# Patient Record
Sex: Female | Born: 1965 | Race: Black or African American | Hispanic: No | Marital: Married | Smoking: Never smoker
Health system: Southern US, Community
[De-identification: ages and names within clinical notes are randomized; demographics above are authoritative.]

## PROBLEM LIST (undated history)

## (undated) DIAGNOSIS — F419 Anxiety disorder, unspecified: Secondary | ICD-10-CM

## (undated) DIAGNOSIS — H469 Unspecified optic neuritis: Secondary | ICD-10-CM

## (undated) DIAGNOSIS — M1990 Unspecified osteoarthritis, unspecified site: Secondary | ICD-10-CM

## (undated) DIAGNOSIS — F32A Depression, unspecified: Secondary | ICD-10-CM

## (undated) DIAGNOSIS — R519 Headache, unspecified: Secondary | ICD-10-CM

## (undated) DIAGNOSIS — M069 Rheumatoid arthritis, unspecified: Secondary | ICD-10-CM

## (undated) DIAGNOSIS — F329 Major depressive disorder, single episode, unspecified: Secondary | ICD-10-CM

## (undated) DIAGNOSIS — K219 Gastro-esophageal reflux disease without esophagitis: Secondary | ICD-10-CM

## (undated) DIAGNOSIS — R51 Headache: Secondary | ICD-10-CM

## (undated) DIAGNOSIS — I10 Essential (primary) hypertension: Secondary | ICD-10-CM

## (undated) DIAGNOSIS — E669 Obesity, unspecified: Secondary | ICD-10-CM

## (undated) HISTORY — PX: KNEE SURGERY: SHX244

## (undated) HISTORY — DX: Headache, unspecified: R51.9

## (undated) HISTORY — DX: Obesity, unspecified: E66.9

## (undated) HISTORY — PX: ABDOMINAL HYSTERECTOMY: SHX81

## (undated) HISTORY — DX: Unspecified optic neuritis: H46.9

## (undated) HISTORY — PX: OTHER SURGICAL HISTORY: SHX169

## (undated) HISTORY — PX: NISSEN FUNDOPLICATION: SHX2091

## (undated) HISTORY — DX: Rheumatoid arthritis, unspecified: M06.9

## (undated) HISTORY — DX: Headache: R51

## (undated) HISTORY — DX: Gastro-esophageal reflux disease without esophagitis: K21.9

## (undated) HISTORY — DX: Unspecified osteoarthritis, unspecified site: M19.90

---

## 2003-12-03 ENCOUNTER — Ambulatory Visit: Payer: Self-pay | Admitting: Cardiology

## 2003-12-22 ENCOUNTER — Ambulatory Visit: Payer: Self-pay | Admitting: Cardiology

## 2003-12-25 ENCOUNTER — Ambulatory Visit: Payer: Self-pay | Admitting: Cardiology

## 2003-12-28 ENCOUNTER — Inpatient Hospital Stay (HOSPITAL_COMMUNITY): Admission: AD | Admit: 2003-12-28 | Discharge: 2003-12-31 | Payer: Self-pay | Admitting: Cardiology

## 2003-12-28 ENCOUNTER — Ambulatory Visit: Payer: Self-pay | Admitting: Cardiology

## 2003-12-29 ENCOUNTER — Ambulatory Visit: Payer: Self-pay | Admitting: Cardiology

## 2004-01-12 ENCOUNTER — Ambulatory Visit: Payer: Self-pay | Admitting: Cardiology

## 2004-02-17 ENCOUNTER — Ambulatory Visit: Payer: Self-pay | Admitting: Cardiology

## 2005-09-06 ENCOUNTER — Emergency Department (HOSPITAL_COMMUNITY): Admission: EM | Admit: 2005-09-06 | Discharge: 2005-09-06 | Payer: Self-pay | Admitting: Emergency Medicine

## 2005-09-06 ENCOUNTER — Emergency Department (HOSPITAL_COMMUNITY): Admission: EM | Admit: 2005-09-06 | Discharge: 2005-09-07 | Payer: Self-pay | Admitting: Emergency Medicine

## 2007-02-22 ENCOUNTER — Ambulatory Visit: Payer: Self-pay | Admitting: Cardiology

## 2007-04-23 ENCOUNTER — Encounter: Admission: RE | Admit: 2007-04-23 | Discharge: 2007-05-16 | Payer: Self-pay | Admitting: Orthopedic Surgery

## 2007-08-14 ENCOUNTER — Encounter: Admission: RE | Admit: 2007-08-14 | Discharge: 2007-08-14 | Payer: Self-pay | Admitting: Sports Medicine

## 2007-09-20 ENCOUNTER — Encounter (HOSPITAL_COMMUNITY): Admission: RE | Admit: 2007-09-20 | Discharge: 2007-10-20 | Payer: Self-pay | Admitting: Sports Medicine

## 2007-12-04 ENCOUNTER — Encounter (HOSPITAL_COMMUNITY): Admission: RE | Admit: 2007-12-04 | Discharge: 2008-01-03 | Payer: Self-pay | Admitting: Orthopaedic Surgery

## 2009-02-20 ENCOUNTER — Encounter: Admission: RE | Admit: 2009-02-20 | Discharge: 2009-02-20 | Payer: Self-pay | Admitting: *Deleted

## 2009-03-08 ENCOUNTER — Inpatient Hospital Stay (HOSPITAL_COMMUNITY): Admission: RE | Admit: 2009-03-08 | Discharge: 2009-03-10 | Payer: Self-pay | Admitting: *Deleted

## 2010-02-21 ENCOUNTER — Encounter: Payer: Self-pay | Admitting: Sports Medicine

## 2010-04-20 LAB — URINALYSIS, ROUTINE W REFLEX MICROSCOPIC
Bilirubin Urine: NEGATIVE
Glucose, UA: NEGATIVE mg/dL
Ketones, ur: NEGATIVE mg/dL
Leukocytes, UA: NEGATIVE
Nitrite: NEGATIVE
Protein, ur: NEGATIVE mg/dL
Specific Gravity, Urine: 1.019 (ref 1.005–1.030)
Urobilinogen, UA: 0.2 mg/dL (ref 0.0–1.0)
pH: 5.5 (ref 5.0–8.0)

## 2010-04-20 LAB — COMPREHENSIVE METABOLIC PANEL
ALT: 17 U/L (ref 0–35)
AST: 19 U/L (ref 0–37)
Albumin: 3.9 g/dL (ref 3.5–5.2)
Alkaline Phosphatase: 78 U/L (ref 39–117)
BUN: 6 mg/dL (ref 6–23)
CO2: 24 mEq/L (ref 19–32)
Calcium: 10.1 mg/dL (ref 8.4–10.5)
Chloride: 104 mEq/L (ref 96–112)
Creatinine, Ser: 0.88 mg/dL (ref 0.4–1.2)
GFR calc Af Amer: >60 mL/min (ref >60)
GFR calc non Af Amer: >60 mL/min (ref >60)
Glucose, Bld: 120 mg/dL — ABNORMAL HIGH (ref 70–99)
Potassium: 3.9 mEq/L (ref 3.5–5.1)
Sodium: 137 mEq/L (ref 135–145)
Total Bilirubin: 0.2 mg/dL — ABNORMAL LOW (ref 0.3–1.2)
Total Protein: 7.9 g/dL (ref 6.0–8.3)

## 2010-04-20 LAB — CBC
HCT: 36 % (ref 36.0–46.0)
HCT: 37.3 % (ref 36.0–46.0)
Hemoglobin: 12 g/dL (ref 12.0–15.0)
Hemoglobin: 12 g/dL (ref 12.0–15.0)
MCHC: 32.2 g/dL (ref 30.0–36.0)
MCHC: 33.3 g/dL (ref 30.0–36.0)
MCV: 71.7 fL — ABNORMAL LOW (ref 78.0–100.0)
MCV: 72.5 fL — ABNORMAL LOW (ref 78.0–100.0)
Platelets: 324 10*3/uL (ref 150–400)
Platelets: 430 10*3/uL — ABNORMAL HIGH (ref 150–400)
RBC: 5.02 MIL/uL (ref 3.87–5.11)
RBC: 5.14 MIL/uL — ABNORMAL HIGH (ref 3.87–5.11)
RDW: 16.4 % — ABNORMAL HIGH (ref 11.5–15.5)
RDW: 16.5 % — ABNORMAL HIGH (ref 11.5–15.5)
WBC: 17.7 10*3/uL — ABNORMAL HIGH (ref 4.0–10.5)
WBC: 23.4 10*3/uL — ABNORMAL HIGH (ref 4.0–10.5)

## 2010-04-20 LAB — ABO/RH: ABO/RH(D): B POS

## 2010-04-20 LAB — PROTIME-INR
INR: 1.09 (ref 0.00–1.49)
Prothrombin Time: 14 seconds (ref 11.6–15.2)

## 2010-04-20 LAB — DIFFERENTIAL
Basophils Absolute: 0 10*3/uL (ref 0.0–0.1)
Basophils Absolute: 0.1 10*3/uL (ref 0.0–0.1)
Basophils Relative: 0 % (ref 0–1)
Basophils Relative: 0 % (ref 0–1)
Eosinophils Absolute: 0 10*3/uL (ref 0.0–0.7)
Eosinophils Absolute: 0.2 10*3/uL (ref 0.0–0.7)
Eosinophils Relative: 0 % (ref 0–5)
Eosinophils Relative: 1 % (ref 0–5)
Lymphocytes Relative: 23 % (ref 12–46)
Lymphocytes Relative: 7 % — ABNORMAL LOW (ref 12–46)
Lymphs Abs: 1.6 10*3/uL (ref 0.7–4.0)
Lymphs Abs: 4 10*3/uL (ref 0.7–4.0)
Monocytes Absolute: 0.9 10*3/uL (ref 0.1–1.0)
Monocytes Absolute: 1.1 10*3/uL — ABNORMAL HIGH (ref 0.1–1.0)
Monocytes Relative: 4 % (ref 3–12)
Monocytes Relative: 6 % (ref 3–12)
Neutro Abs: 12.3 10*3/uL — ABNORMAL HIGH (ref 1.7–7.7)
Neutro Abs: 20.9 10*3/uL — ABNORMAL HIGH (ref 1.7–7.7)
Neutrophils Relative %: 70 % (ref 43–77)
Neutrophils Relative %: 89 % — ABNORMAL HIGH (ref 43–77)

## 2010-04-20 LAB — URINE MICROSCOPIC-ADD ON

## 2010-04-20 LAB — APTT: aPTT: 25 seconds (ref 24–37)

## 2010-04-20 LAB — TYPE AND SCREEN
ABO/RH(D): B POS
Antibody Screen: NEGATIVE

## 2010-06-17 NOTE — Cardiovascular Report (Signed)
NAME:  MARYLON, VERNO NO.:  [id number]   MEDICAL RECORD NO.:  [id number]          PATIENT TYPE:  INP   LOCATION:  4705                         FACILITY:  [facility]   PHYSICIAN:  Jonelle Sidle, M.D. LHCDATE OF BIRTH:  06-Jul-1965   DATE OF PROCEDURE:  DATE OF DISCHARGE:                              CARDIAC CATHETERIZATION   PRIMARY CARE PHYSICIAN:  Doreen Beam, M.D.   CARDIOLOGIST:  Willa Rough, M.D.   INDICATION:  Ms. Dareen Piano is a 45 year old woman with a history of  nonobstructive coronary atherosclerosis documented at cardiac  catheterization in [place], [place], back in January of 2004.  At that  time, she was evaluated following an abnormal Cardiolite.  She was recently  seen at [facility] with chest discomfort and ruled out for a  myocardial infarction.  She subsequently underwent an exercise  echocardiogram which was reported as negative for ischemia, although with  poor exercise tolerance.  She was referred based on recurrent chest pain and  an abnormal electrocardiogram at rest for diagnostic coronary angiography to  clearly assess the coronary anatomy.  This was originally scheduled  electively as an outpatient;  however, the patient presented to the hospital  with recurrent chest pain and is now referred to the inpatient laboratory.  Her cardiac markers at present are normal.   PROCEDURES PERFORMED:  1.  Left heart catheterization.  2.  Selective coronary angiography.  3.  Left ventriculography.   ACCESS AND EQUIPMENT:  The area about the right femoral artery was  anesthetized with 1% lidocaine, and a 6-French sheath was placed in the  right femoral artery via the modified Seldinger technique.  Standard  preformed 6-French JL-4 and JR-4 catheters were used for selective coronary  angiography, and an angled pigtail catheter was used for left heart  catheterization and left ventriculography.  All exchanges were made over a  wire,  and the patient tolerated the procedure well without immediate  complications.   HEMODYNAMICS:  Left ventricle 112/18 mmHg, aorta 111/83 mmHg.   ANGIOGRAPHIC FINDINGS:  1.  The left main coronary artery is free of significant flow-limiting      coronary atherosclerosis.  2.  The left anterior descending is a medium caliber vessel with three      diagonal branches.  There is approximately 25% stenosis in the ostium of      the first diagonal branch which is a fairly small vessel.  No flow-      limiting stenoses are noted within the left anterior descending proper.  3.  The circumflex coronary artery is a small-to-medium caliber vessel.      There is an atrial branch noted arising from the distal portion of the      circumflex.  There is essentially a small bifurcating obtuse marginal      distally, and there is no significant flow-limiting coronary      atherosclerosis noted within the circumflex system.  4.  There is a large ramus intermedius branch noted without significant flow-      limiting coronary atherosclerosis.  5.  The right coronary artery is  dominant and medium in caliber.  There is a      large posterior descending branch noted.  No significant flow-limiting      coronary atherosclerosis is noted within the system.   LEFT VENTRICULOGRAPHY:  This was performed in the RAO projection and  revealed an ejection fraction of approximately 75% with no focal wall motion  abnormalities.  Trace mitral regurgitation is noted in the setting of  ventricular ectopy.   DIAGNOSES:  1.  No major flow-limiting coronary atherosclerosis noted within the      epicardial vessels.  There is a 25% stenosis in the ostium of a small      first diagonal branch.  2.  Left ventricular ejection fraction of approximately 75% with trace      mitral regurgitation noted in the setting of ventricular ectopy.   RECOMMENDATIONS:  Would suspect that the patient's chest pain is noncardiac  based on her  coronary anatomy.  Will plan risk factor modification at this  time.       SGM/MEDQ  D:  12/29/2003  T:  12/29/2003  Job:  [id number]

## 2010-06-17 NOTE — Discharge Summary (Signed)
NAME:  Tracey Mccarthy, Tracey Mccarthy NO.:  [id number]   MEDICAL RECORD NO.:  [id number]          PATIENT TYPE:  INP   LOCATION:  4705                         FACILITY:  [facility]   PHYSICIAN:  Willa Rough, M.D.     DATE OF BIRTH:  05-09-1965   DATE OF ADMISSION:  12/28/2003  DATE OF DISCHARGE:  12/31/2003                                 DISCHARGE SUMMARY   DISCHARGING DIAGNOSES:  1.  Chest pain, status post cardiac catheterization on December 29, 2003      showing an ejection fraction of approximately 75%, with no focal wall      motion abnormalities, trace of mitral regurgitation, in the setting of      ventricular ectopy.  No major flow-limiting coronary atherosclerosis      noted within the epicardial vessels.  There is a 25% stenosis in the      ostium of a small first diagonal branch.  2.  Hiatal hernia by chest computed tomography.  3.  A 4 x 6 pulmonary nodule in the right middle lobe.  Radiology recommends      a limited noncontrast chest computed tomography followup in three months      to reassess.   PAST MEDICAL HISTORY:  1.  Hypertension.  2.  GERD.  3.  Obesity.   HISTORY OF PRESENT ILLNESS:  This is a 45 year old, African-American female  with a history of hypertension who is being treated with Diovan and  hydrochlorothiazide.  She also has a history of GERD, treated with Protonix.  The patient states she had an abnormal exercise electrocardiogram in  December 2004.  She then claimed after she failed her treadmill she had a  heart catheterization at [facility], where she claims no balloon  angioplasty or stenting was needed and that her catheterization was normal  at that time.  On this admission, the patient was initially admitted to  [facility] with complaints of chest pain and ruled out for a  myocardial infarction.  She subsequently underwent an exercise cardiogram  which was reported as negative for ischemia, although was poor exercise  tolerance.  The patient was originally scheduled for an outpatient  catheterization.  However, the patient presented to [facility] with complaints  of chest pain and was referred to [facility] for inpatient workup.   HOSPITAL COURSE:  Patient taken to catheterization lab on December 29, 2003.  Results as stated above.  The patient tolerated the procedure without any  complications.  Continued to complain of shortness of breath.  Postcatheterization, decreased exercise tolerance.  Lab work was obtained.  D-dimer elevated at 0.86.  It was felt that the patient should have a spiral  CT to rule out pulmonary embolism.  On December 30, 2003, during CT scan of  chest, the patient received a 15 cc injection of Omnipaque contrast into the  right forearm IV site which had infiltrated.  IV was removed.  No  complications arose from infiltrated IV of contrast.  However, patient kept  overnight to apply ice packs, elevate the forearm, and reassess in the  morning.  On  December 31, 2003, patient without complaints of any discomfort;  afebrile; blood pressure 100/70; saturating 100% on room air; pulse 83 and  regular; respirations 20.  Hemoglobin 11.5, hematocrit 34.2.  Patient being  discharged home.   LABORATORY WORK THIS ADMISSION:  Total cholesterol 100, triglycerides 92,  HDL 41, LDL 41.  Ferritin level 41.  Total iron 80, total iron binding  capacity 282, percent saturation 11.  WBC 8.2, hemoglobin 11.5, hematocrit  34.2, platelets 386.  Sodium 136, potassium 3.6, BUN 6, creatinine 0.9.  The  patient also has a history of microcytic anemia which would explain the low  iron count.   DISPOSITION:  Patient being discharged home, with medications she previously  was on, including Protonix 40 mg daily; Diovan/hydrochlorothiazide 80/12.5  mg daily; and coated aspirin 81 daily.   PAIN MANAGEMENT:  Tylenol for general discomfort.   ACTIVITY:  No heavy lifting greater than 10 pounds x 1 week.   WOUND  CARE:  Gently clean catheterization site with soap and water.  No tub  bathing or swimming x 1 week.   She is to call our office for any fever, any swelling or pain from  catheterization site.   She has a follow-up appointment in our [facility] with Suszanne Conners. Julious Oka., on Tuesday January 12, 2004 at 12:00 noon.  she also has a follow-up  appointment with her primary care doctor, Dr. Sherril Croon, in two weeks.  She will  also need a repeat CT of the chest in three months.  This can be done  through primary care physician.       MB/MEDQ  D:  12/31/2003  T:  12/31/2003  Job:  [id number]   cc:   Suszanne Conners. Julious Oka.   Doreen Beam  [address]  [place]  [place] [address]  Fax: [phone]

## 2010-09-30 DIAGNOSIS — R079 Chest pain, unspecified: Secondary | ICD-10-CM

## 2010-10-05 ENCOUNTER — Encounter (HOSPITAL_COMMUNITY)
Admission: RE | Admit: 2010-10-05 | Discharge: 2010-10-05 | Disposition: A | Discharge status: Home / Self Care | Payer: Medicare Other | Source: Ambulatory Visit | Attending: Orthopedic Surgery | Admitting: Orthopedic Surgery

## 2010-10-05 ENCOUNTER — Other Ambulatory Visit (HOSPITAL_COMMUNITY): Payer: Self-pay | Admitting: Orthopedic Surgery

## 2010-10-05 ENCOUNTER — Ambulatory Visit (HOSPITAL_COMMUNITY)
Admission: RE | Admit: 2010-10-05 | Discharge: 2010-10-05 | Disposition: A | Discharge status: Home / Self Care | Payer: Medicare Other | Source: Ambulatory Visit | Attending: Orthopedic Surgery | Admitting: Orthopedic Surgery

## 2010-10-05 DIAGNOSIS — R109 Unspecified abdominal pain: Secondary | ICD-10-CM

## 2010-10-05 DIAGNOSIS — R10A1 Flank pain, right side: Secondary | ICD-10-CM

## 2010-10-05 LAB — CBC
HCT: 35.4 % — ABNORMAL LOW (ref 36.0–46.0)
Hemoglobin: 11.6 g/dL — ABNORMAL LOW (ref 12.0–15.0)
MCH: 22.7 pg — ABNORMAL LOW (ref 26.0–34.0)
MCHC: 32.8 g/dL (ref 30.0–36.0)
MCV: 69.3 fL — ABNORMAL LOW (ref 78.0–100.0)
Platelets: 375 10*3/uL (ref 150–400)
RBC: 5.11 MIL/uL (ref 3.87–5.11)
RDW: 15.4 % (ref 11.5–15.5)
WBC: 7.9 10*3/uL (ref 4.0–10.5)

## 2010-10-05 LAB — COMPREHENSIVE METABOLIC PANEL
ALT: 12 U/L (ref 0–35)
AST: 15 U/L (ref 0–37)
Albumin: 3.3 g/dL — ABNORMAL LOW (ref 3.5–5.2)
Alkaline Phosphatase: 83 U/L (ref 39–117)
BUN: 10 mg/dL (ref 6–23)
CO2: 27 mEq/L (ref 19–32)
Calcium: 9.5 mg/dL (ref 8.4–10.5)
Chloride: 100 mEq/L (ref 96–112)
Creatinine, Ser: 0.87 mg/dL (ref 0.50–1.10)
GFR calc Af Amer: >60 mL/min (ref >60)
GFR calc non Af Amer: >60 mL/min (ref >60)
Glucose, Bld: 117 mg/dL — ABNORMAL HIGH (ref 70–99)
Potassium: 3.1 mEq/L — ABNORMAL LOW (ref 3.5–5.1)
Sodium: 139 mEq/L (ref 135–145)
Total Bilirubin: 0.1 mg/dL — ABNORMAL LOW (ref 0.3–1.2)
Total Protein: 7.6 g/dL (ref 6.0–8.3)

## 2010-10-05 LAB — DIFFERENTIAL
Basophils Absolute: 0 10*3/uL (ref 0.0–0.1)
Basophils Relative: 0 % (ref 0–1)
Eosinophils Absolute: 0.3 10*3/uL (ref 0.0–0.7)
Eosinophils Relative: 4 % (ref 0–5)
Lymphocytes Relative: 33 % (ref 12–46)
Lymphs Abs: 2.6 10*3/uL (ref 0.7–4.0)
Monocytes Absolute: 0.6 10*3/uL (ref 0.1–1.0)
Monocytes Relative: 8 % (ref 3–12)
Neutro Abs: 4.4 10*3/uL (ref 1.7–7.7)
Neutrophils Relative %: 55 % (ref 43–77)

## 2010-10-05 LAB — URINALYSIS, ROUTINE W REFLEX MICROSCOPIC
Bilirubin Urine: NEGATIVE
Glucose, UA: NEGATIVE mg/dL
Hgb urine dipstick: NEGATIVE
Ketones, ur: NEGATIVE mg/dL
Leukocytes, UA: NEGATIVE
Nitrite: NEGATIVE
Protein, ur: NEGATIVE mg/dL
Specific Gravity, Urine: 1.018 (ref 1.005–1.030)
Urobilinogen, UA: 0.2 mg/dL (ref 0.0–1.0)
pH: 5.5 (ref 5.0–8.0)

## 2010-10-05 LAB — PROTIME-INR
INR: 1.04 (ref 0.00–1.49)
Prothrombin Time: 13.8 seconds (ref 11.6–15.2)

## 2010-10-05 LAB — TYPE AND SCREEN
ABO/RH(D): B POS
Antibody Screen: NEGATIVE

## 2010-10-05 LAB — SURGICAL PCR SCREEN
MRSA, PCR: NEGATIVE
Staphylococcus aureus: POSITIVE — AB

## 2010-10-05 LAB — APTT: aPTT: 29 seconds (ref 24–37)

## 2010-10-06 ENCOUNTER — Ambulatory Visit (HOSPITAL_COMMUNITY)
Admission: RE | Admit: 2010-10-06 | Discharge: 2010-10-06 | Disposition: A | Discharge status: Home / Self Care | Payer: Medicare Other | Source: Ambulatory Visit | Attending: Orthopedic Surgery | Admitting: Orthopedic Surgery

## 2010-10-06 DIAGNOSIS — Z01812 Encounter for preprocedural laboratory examination: Secondary | ICD-10-CM | POA: Insufficient documentation

## 2010-10-06 DIAGNOSIS — M549 Dorsalgia, unspecified: Secondary | ICD-10-CM | POA: Insufficient documentation

## 2010-10-06 DIAGNOSIS — G8929 Other chronic pain: Secondary | ICD-10-CM | POA: Insufficient documentation

## 2010-10-06 DIAGNOSIS — E669 Obesity, unspecified: Secondary | ICD-10-CM | POA: Insufficient documentation

## 2010-10-06 DIAGNOSIS — I10 Essential (primary) hypertension: Secondary | ICD-10-CM | POA: Insufficient documentation

## 2010-10-06 DIAGNOSIS — Z462 Encounter for fitting and adjustment of other devices related to nervous system and special senses: Secondary | ICD-10-CM | POA: Insufficient documentation

## 2010-10-06 DIAGNOSIS — K219 Gastro-esophageal reflux disease without esophagitis: Secondary | ICD-10-CM | POA: Insufficient documentation

## 2010-10-06 DIAGNOSIS — Z01818 Encounter for other preprocedural examination: Secondary | ICD-10-CM | POA: Insufficient documentation

## 2010-10-06 DIAGNOSIS — I2510 Atherosclerotic heart disease of native coronary artery without angina pectoris: Secondary | ICD-10-CM | POA: Insufficient documentation

## 2010-10-07 NOTE — Op Note (Signed)
  NAMESHERHONDA, GASPAR NO.:  [id number]  MEDICAL RECORD NO.:  [id number]  LOCATION:  SDSC                         FACILITY:  [facility]  PHYSICIAN:  Estill Bamberg, MD      DATE OF BIRTH:  [birth year]  DATE OF PROCEDURE:  10/06/2010 DATE OF DISCHARGE:                              OPERATIVE REPORT   PREOPERATIVE DIAGNOSIS:  Left-sided flank pain secondary to spinal cord stimulator battery status post spinal cord stimulator placement.  POSTOPERATIVE DIAGNOSIS:  Left-sided flank pain secondary to spinal cord stimulator battery status post spinal cord stimulator placement.  PROCEDURE:  Removal of implanted spinal cord stimulator battery from the left flank.  SURGEON:  Estill Bamberg, MD  ASSISTANT:  None.  ANESTHESIA:  General endotracheal anesthesia.  ESTIMATED BLOOD LOSS:  Minimal.  COMPLICATIONS:  None.  DISPOSITION:  Stable.  INDICATIONS FOR PROCEDURE:  Briefly, Ms. Tracey Mccarthy is a very pleasant 45 year old female who previously underwent placement of a spinal cord stimulator by me.  The battery for the spinal cord stimulator was placed at the patient's left flank, as per her request.  The patient did get relief from her spinal cord stimulator utilization but began to feel that the area of the battery at her left flank was symptomatic and causing her discomfort.  She did have an evaluation by me and she did request that the battery be removed.  I did fully explained the risks and benefits of the procedure including infection and lack of resolution of her flank pain.  The patient did agree to go forward with removal of her spinal cord stimulator battery.  OPERATIVE DETAILS:  On October 06, 2010, the patient was brought to surgery and general endotracheal anesthesia was administered.  The patient was placed prone on a flat Jackson bed.  Bolster placed under the patient's chest and hips.  The area of the previous transverse incision over the left flank was  identified.  The back was prepped and draped in usual fashion.  SCDs were placed and antibiotics were given. I then utilized the patient's previous incision.  I did use electrocautery to perform subcutaneous dissection and the spinal cord stimulator battery was readily identified.  The battery was removed from the wound.  I did use the [facility] screwdriver to remove the screws which were utilized to secure the spinal cord stimulator leads into the battery.  The spinal cord stimulator leads were then removed from the battery.  The battery was then uneventfully removed. The wound was then copiously irrigated.  The deep and subcutaneous tissue was closed using 2-0 Vicryl.  The skin was closed using 4-0 Monocryl.  Sterile dressing was applied.  The patient was then awoken from general endotracheal anesthesia and transferred to recovery in stable condition. All instrument counts were correct at the termination of the procedure.     Estill Bamberg, MD     MD/MEDQ  D:  10/06/2010  T:  10/06/2010  Job:  [id number] Electronically Signed by Estill Bamberg  on 10/07/2010 02:33:45 PM

## 2012-01-01 ENCOUNTER — Emergency Department (HOSPITAL_COMMUNITY): Payer: PRIVATE HEALTH INSURANCE

## 2012-01-01 ENCOUNTER — Emergency Department (HOSPITAL_COMMUNITY)
Admission: EM | Admit: 2012-01-01 | Discharge: 2012-01-01 | Disposition: A | Discharge status: Home / Self Care | Payer: PRIVATE HEALTH INSURANCE | Attending: Emergency Medicine | Admitting: Emergency Medicine

## 2012-01-01 ENCOUNTER — Encounter (HOSPITAL_COMMUNITY): Payer: Self-pay | Admitting: *Deleted

## 2012-01-01 DIAGNOSIS — W19XXXA Unspecified fall, initial encounter: Secondary | ICD-10-CM

## 2012-01-01 DIAGNOSIS — I10 Essential (primary) hypertension: Secondary | ICD-10-CM | POA: Insufficient documentation

## 2012-01-01 DIAGNOSIS — S8000XA Contusion of unspecified knee, initial encounter: Secondary | ICD-10-CM | POA: Insufficient documentation

## 2012-01-01 DIAGNOSIS — Y939 Activity, unspecified: Secondary | ICD-10-CM | POA: Insufficient documentation

## 2012-01-01 DIAGNOSIS — Y9269 Other specified industrial and construction area as the place of occurrence of the external cause: Secondary | ICD-10-CM | POA: Insufficient documentation

## 2012-01-01 DIAGNOSIS — S8001XA Contusion of right knee, initial encounter: Secondary | ICD-10-CM

## 2012-01-01 DIAGNOSIS — R296 Repeated falls: Secondary | ICD-10-CM | POA: Insufficient documentation

## 2012-01-01 DIAGNOSIS — S8002XA Contusion of left knee, initial encounter: Secondary | ICD-10-CM

## 2012-01-01 HISTORY — DX: Essential (primary) hypertension: I10

## 2012-01-01 MED ORDER — NAPROXEN 500 MG PO TABS: 500.0000 mg | ORAL_TABLET | Freq: Two times a day (BID) | ORAL | Status: DC

## 2012-01-01 MED ORDER — OXYCODONE-ACETAMINOPHEN 5-325 MG PO TABS
1.0000 | ORAL_TABLET | Freq: Once | ORAL | Status: AC
  Administered 2012-01-01: 1 via ORAL
  Filled 2012-01-01: qty 1
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
## 2012-01-03 NOTE — ED Provider Notes (Signed)
Medical screening examination/treatment/procedure(s) were performed by non-physician practitioner and as supervising physician I was immediately available for consultation/collaboration.  Shelda Jakes, MD 01/03/12 [phone]

## 2012-02-08 ENCOUNTER — Emergency Department (HOSPITAL_COMMUNITY)
Admission: EM | Admit: 2012-02-08 | Discharge: 2012-02-08 | Disposition: A | Discharge status: Home / Self Care | Payer: PRIVATE HEALTH INSURANCE | Attending: Emergency Medicine | Admitting: Emergency Medicine

## 2012-02-08 ENCOUNTER — Ambulatory Visit (INDEPENDENT_AMBULATORY_CARE_PROVIDER_SITE_OTHER): Payer: PRIVATE HEALTH INSURANCE | Admitting: Orthopedic Surgery

## 2012-02-08 ENCOUNTER — Encounter (HOSPITAL_COMMUNITY): Payer: Self-pay | Admitting: Emergency Medicine

## 2012-02-08 ENCOUNTER — Encounter: Payer: Self-pay | Admitting: Orthopedic Surgery

## 2012-02-08 VITALS — BP 124/78 | Ht 62.0 in | Wt 255.0 lb

## 2012-02-08 DIAGNOSIS — M25569 Pain in unspecified knee: Secondary | ICD-10-CM | POA: Insufficient documentation

## 2012-02-08 DIAGNOSIS — M2241 Chondromalacia patellae, right knee: Secondary | ICD-10-CM

## 2012-02-08 DIAGNOSIS — R42 Dizziness and giddiness: Secondary | ICD-10-CM

## 2012-02-08 DIAGNOSIS — M25561 Pain in right knee: Secondary | ICD-10-CM

## 2012-02-08 DIAGNOSIS — M2240 Chondromalacia patellae, unspecified knee: Secondary | ICD-10-CM

## 2012-02-08 DIAGNOSIS — Z79899 Other long term (current) drug therapy: Secondary | ICD-10-CM | POA: Insufficient documentation

## 2012-02-08 DIAGNOSIS — I10 Essential (primary) hypertension: Secondary | ICD-10-CM | POA: Insufficient documentation

## 2012-02-08 DIAGNOSIS — IMO0002 Reserved for concepts with insufficient information to code with codable children: Secondary | ICD-10-CM | POA: Insufficient documentation

## 2012-02-08 LAB — GLUCOSE, CAPILLARY: Glucose-Capillary: 89 mg/dL (ref 70–99)

## 2012-02-08 MED ORDER — SODIUM CHLORIDE 0.9 % IV SOLN: 1000.0000 mL | INTRAVENOUS | Status: DC

## 2012-02-08 MED ORDER — ONDANSETRON 8 MG PO TBDP
8.0000 mg | ORAL_TABLET | Freq: Once | ORAL | Status: AC
  Administered 2012-02-08: 8 mg via ORAL
  Filled 2012-02-08: qty 1

## 2012-02-08 MED ORDER — MECLIZINE HCL 25 MG PO TABS: 25.0000 mg | ORAL_TABLET | Freq: Three times a day (TID) | ORAL | Status: DC | PRN

## 2012-02-08 MED ORDER — MECLIZINE HCL 12.5 MG PO TABS
25.0000 mg | ORAL_TABLET | Freq: Once | ORAL | Status: AC
  Administered 2012-02-08: 25 mg via ORAL
  Filled 2012-02-08: qty 2

## 2012-02-08 MED ORDER — SODIUM CHLORIDE 0.9 % IV SOLN
1000.0000 mL | Freq: Once | INTRAVENOUS | Status: AC
  Administered 2012-02-08: 1000 mL via INTRAVENOUS

## 2012-02-08 NOTE — ED Notes (Addendum)
Pt received cortisone shot in left knee at pcp office and had a near syncopal episode upon standing. [id number]. Pt a and o x 4 upon arrival to ed. nad noted.
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
## 2012-02-08 NOTE — Patient Instructions (Addendum)
You have received a steroid shot. 15% of patients experience increased pain at the injection site with in the next 24 hours. This is best treated with ice and tylenol extra strength 2 tabs every 8 hours. If you are still having pain please call the office.   Call to arrange PT in [place] Continue nabumetone   Chondromalacia Your exam shows your knee pain is likely due to a cartilage swelling and irritation under the knee cap called chondromalacia. The knee cap moves up and down in its groove when you walk, run, or squat. It can become irritated from sports or work activities if the knee cap is not lined up perfectly or your quadriceps muscle is relatively weak. This can cause pain, usually around the knee cap but sometimes the back of the knee. It is most common in young and active people. Climbing stairs, prolonged sitting and rising from a chair will often make the pain worse. Treatment includes rest from activities which make it worse. The pain can be reduced with ice packs and anti-inflammatory pain medicine. Exercises to strengthen the thigh (quadriceps) muscle may help prevent further episodes of this condition. Shoe inserts to correct imbalances in the legs or feet may be prescribed by your doctor or a specialist. Support for the knee cap with a light brace may also be helpful. Call your caregiver if you are not improving after 2 - 3 weeks of treatment.   SEEK MEDICAL CARE IF:   You have increasing pain or your knee becomes hot, swollen, red, or begins to give out or lock up on you. Document Released: 02/24/2004 Document Revised: 04/10/2011 Document Reviewed: 07/14/2008 [facility] Patient Information 2013 [place], [place].   Patella Problems (Patellofemoral Syndrome) This syndrome is caused by changes in the undersurface of the kneecap (patella). The changes vary from minor inflammation to major changes such as breakdown of the cartilage on the undersurface of the patella. The major changes can  be seen with an arthroscope (a small, pencil-sized telescope). These changes can result from various factors. These factors may arise from abnormal tracking (movement or malalignment) of the patella. Normally the Patella is in its normal groove located between the condyles (grooved end) of the femur (thigh bone). Abnormal movement leads to increased pressure in the patellofemoral joint. This leads to swelling in the cartilage, inflammation and pain. SYMPTOMS   The patient with this syndrome usually has an ache in the knee. It is often aggravated by:  Prolonged sitting.   Squatting.   Climbing stairs.   Running down hill.   Other exercising that stresses the knee.  Other findings may include the knee giving way, swelling, and or locking. TREATMENT   The treatment will depend on the cause of the problem. Sometimes the solution is as simple as cutting down on activities. Giving your joint a rest with the use of crutches and braces can also help. This is generally followed by strengthening exercises. RECOVERY Recovery from a patellar problem depends on the type of problem in your knee and on the treatment required. If conservative treatment works the recovery period may be as little as three to four weeks. If more aggressive therapy such as surgery is required, the recovery period may be several months. Your caregiver will discuss this with you. HOME CARE INSTRUCTIONS  Following exercise, use an ice pack for twenty to thirty minutes three to four times per day. Use a towel between your ice pack and the skin.   Reduction of inflammation with  anti-inflammatories may be helpful. Only take over-the-counter or prescription medicines for pain, discomfort, or fever as directed by your caregiver.   Taping the knee or using a neoprene sleeve with a patellar cutout to provide better tracking of the patella may give relief.   Muscle (quadriceps) strengthening exercises are helpful. Follow your caregiver's  advice.   Muscle stretching prior to exercise may be helpful.   Soft tissue therapy using ultrasound, and diathermy may be helpful.   If conservative therapy is not effective, surgery may provide relief. During arthroscopy, your caregiver may discover a rough surface beneath your kneecap. If this happens, your caregiver may smooth this out by shaving the surface.  SEEK MEDICAL CARE IF: If you have surgery, see your caregiver if:  There is increased bleeding or clear fluid (more than a small spot) from the wound.   You notice redness, swelling, or increasing pain in the wound.   Pus is coming from wound.   You develop an unexplained oral temperature above 102 F (38.9 C) develops, or as your caregiver suggests.   You notice a foul smell coming from the wound or dressing.   You develop increasing pain or stiffness in your knee.  SEEK IMMEDIATE MEDICAL CARE IF:    You develop a rash.   You have difficulty breathing.   You have any allergic problems.  MAKE SURE YOU:    Understand these instructions.   Will watch your condition.   Will get help right away if you are not doing well or get worse.  Document Released: 01/14/2000 Document Revised: 04/10/2011 Document Reviewed: 02/03/2008 [facility] Patient Information 2013 [place], [place].

## 2012-02-08 NOTE — ED Provider Notes (Signed)
History     CSN: [id number]  Arrival date & time 02/08/12  1016   First MD Initiated Contact with Patient 02/08/12 1017      Chief Complaint  Patient presents with  . Dizziness    (Consider location/radiation/quality/duration/timing/severity/associated sxs/prior treatment) HPI Comments: The patient states she was seeing her physician for injection in the left knee. 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 The patient states that during the procedure she became lightheaded and when she went to stand up after the procedure that she lost her balance and fell. She was then sent to the emergency department for evaluation. The patient denies headache. She denies loss of consciousness. The been no recent changes in her diet. There's been no recent changes in her medications. She denies any loss of function or feeling in any one side or one portion of her body. She has not had any speech changes. The patient denies any palpitations. She has not taken anything for this particular episode.  The history is provided by the patient.    Past Medical History  Diagnosis Date  . Hypertension     Past Surgical History  Procedure Date  . Abdominal hysterectomy   . Back surgery     No family history on file.  History  Substance Use Topics  . Smoking status: Never Smoker   . Smokeless tobacco: Not on file  . Alcohol Use: No    OB History    Grav Para Term Preterm Abortions TAB SAB Ect Mult Living                  Review of Systems  Constitutional: Negative for fever, activity change, appetite change and fatigue.       All ROS Neg except as noted in HPI  HENT: Negative.   Eyes: Negative for photophobia and discharge.  Respiratory: Negative for cough, shortness of breath and wheezing.   Cardiovascular: Negative for chest pain and palpitations.  Gastrointestinal: Negative for abdominal pain and blood in stool.  Genitourinary: Negative for  dysuria, frequency and hematuria.  Musculoskeletal: Positive for back pain and arthralgias.  Skin: Negative.   Neurological: Positive for dizziness and light-headedness. Negative for seizures, syncope, speech difficulty, weakness and numbness.  Psychiatric/Behavioral: Negative for hallucinations and confusion.    Allergies  Codeine  Home Medications   Current Outpatient Rx  Name  Route  Sig  Dispense  Refill  . ALBUTEROL SULFATE (2.5 MG/3ML) 0.083% IN NEBU   Nebulization   Take 2.5 mg by nebulization every 6 (six) hours as needed.         . ATENOLOL 25 MG PO TABS   Oral   Take 25 mg by mouth daily.         Marland Kitchen DICLOFENAC SODIUM 0.1 % OP SOLN      1 drop 4 (four) times daily.         Marland Kitchen ESOMEPRAZOLE MAGNESIUM 40 MG PO CPDR   Oral   Take 40 mg by mouth daily before breakfast.         . FEXOFENADINE HCL 180 MG PO TABS   Oral   Take 180 mg by mouth daily.         Marland Kitchen FLUTICASONE PROPIONATE 50 MCG/ACT NA SUSP   Nasal   Place 2 sprays into the nose daily.         . FUROSEMIDE 40 MG PO TABS   Oral  Take 40 mg by mouth daily.         Marland Kitchen GABAPENTIN 600 MG PO TABS   Oral   Take 600 mg by mouth 3 (three) times daily.         Marland Kitchen NABUMETONE 500 MG PO TABS   Oral   Take 500 mg by mouth 2 (two) times daily.         Marland Kitchen NAPROXEN 500 MG PO TABS   Oral   Take 1 tablet (500 mg total) by mouth 2 (two) times daily with a meal.   20 tablet   0   . OMEPRAZOLE 20 MG PO CPDR   Oral   Take 20 mg by mouth daily.         . OXYCODONE-ACETAMINOPHEN 7.5-325 MG PO TABS   Oral   Take 2 tablets by mouth every 4 (four) hours as needed.         Marland Kitchen POTASSIUM CHLORIDE CRYS ER 20 MEQ PO TBCR   Oral   Take 20 mEq by mouth 2 (two) times daily.         . TRAZODONE HCL 100 MG PO TABS   Oral   Take 100 mg by mouth at bedtime.           BP 132/76  Pulse 72  Temp 98 F (36.7 C) (Oral)  Resp 20  Ht 5\' 2"  (1.575 m)  Wt 254 lb (115.214 kg)  BMI 46.46 kg/m2  SpO2  98%  Physical Exam  Nursing note and vitals reviewed. Constitutional: She is oriented to person, place, and time. She appears well-developed and well-nourished.  Non-toxic appearance.  HENT:  Head: Normocephalic.  Right Ear: Tympanic membrane and external ear normal.  Left Ear: Tympanic membrane and external ear normal.  Eyes: EOM and lids are normal. Pupils are equal, round, and reactive to light.  Neck: Normal range of motion. Neck supple. Carotid bruit is not present.       No carotid bruit appreciated.  Cardiovascular: Normal rate, regular rhythm, normal heart sounds, intact distal pulses and normal pulses.  Exam reveals no gallop and no friction rub.   No murmur heard. Pulmonary/Chest: Breath sounds normal. No respiratory distress.  Abdominal: Soft. Bowel sounds are normal. There is no tenderness. There is no guarding.  Musculoskeletal: Normal range of motion.       There is decreased range of motion of both hips and both knees. There is crepitus of the knees. There degenerative joint disease deformity is noted of the knees. The knees are warm but not hot. There is no posterior mass appreciated. The Achilles tendon is intact. The dorsalis pedis pulses are symmetrical. The capillary refill is less than 3 seconds.  Lymphadenopathy:       Head (right side): No submandibular adenopathy present.       Head (left side): No submandibular adenopathy present.    She has no cervical adenopathy.  Neurological: She is alert and oriented to person, place, and time. She has normal strength. No cranial nerve deficit or sensory deficit. She exhibits normal muscle tone. Coordination normal.  Skin: Skin is warm and dry.  Psychiatric: She has a normal mood and affect. Her speech is normal.    ED Course  Procedures (including critical care time)  Labs Reviewed - No data to display No results found.   No diagnosis found.    MDM  I have reviewed nursing notes, vital signs, and all appropriate  lab and imaging results for this  patient. The patient has not had any lightheadedness or dizzy sensations of being in the emergency department. I have had her change positions as well as stand and could not reproduce any of the lightheadedness or dizzy sensations. The electrocardiogram shows a normal sinus rhythm at 70 beats per minute with no high degree blocks or other arrhythmia problems. The patient's vital signs seemed to be stable. Feel that it is safe for the patient to be discharged home at this time. Patient is to return if any changes, problems, or concerns.  At discharge while getting into the wheelchair, the patient became nauseated and had an episode of vomiting. The patient also states that she now feels lightheaded again. Patient treated with Zofran and Antivert.   2:47pm - after IV fluids, Antivert, and nausea medicine, the patient was observed and no further vomiting. Dizziness improved. I have ambulated the patient in the room and Hall without vomiting or return of the dizzy sensation. Feel that it is safe for the patient to be discharged home. Patient is given a prescription for meclizine 25 mg 3 times daily. Patient is advised to return if any changes, problems, or concerns.   Kathie Dike, [place] 02/14/12 [phone]

## 2012-02-08 NOTE — ED Notes (Signed)
Pt assisted to BR, pt very unsteady and squats due to knees "are bad", pt assisted to wheelchair, pt states she is nauseated, gave ginger ale due to request, H. Beverely Pace, PA made aware, once returned to pt, pt had vomited, H. Beverely Pace, PA made aware of pt's vomiting, pt returned to room and order received for zofran, pt received zofran and will continue to monitor

## 2012-02-14 ENCOUNTER — Ambulatory Visit (HOSPITAL_COMMUNITY)
Admission: RE | Admit: 2012-02-14 | Discharge: 2012-02-14 | Disposition: A | Discharge status: Home / Self Care | Payer: PRIVATE HEALTH INSURANCE | Source: Ambulatory Visit | Attending: Orthopedic Surgery | Admitting: Orthopedic Surgery

## 2012-02-14 DIAGNOSIS — IMO0001 Reserved for inherently not codable concepts without codable children: Secondary | ICD-10-CM | POA: Insufficient documentation

## 2012-02-14 DIAGNOSIS — M25569 Pain in unspecified knee: Secondary | ICD-10-CM | POA: Insufficient documentation

## 2012-02-14 DIAGNOSIS — M6281 Muscle weakness (generalized): Secondary | ICD-10-CM | POA: Insufficient documentation

## 2012-02-14 DIAGNOSIS — R262 Difficulty in walking, not elsewhere classified: Secondary | ICD-10-CM | POA: Insufficient documentation
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
## 2012-02-15 NOTE — ED Provider Notes (Signed)
Medical screening examination/treatment/procedure(s) were performed by non-physician practitioner and as supervising physician I was immediately available for consultation/collaboration.   Aleyna Cueva M Sanjana Folz, DO 02/15/12 1759 

## 2012-02-20 ENCOUNTER — Inpatient Hospital Stay (HOSPITAL_COMMUNITY): Admission: RE | Admit: 2012-02-20 | Payer: PRIVATE HEALTH INSURANCE | Source: Ambulatory Visit

## 2012-02-22 ENCOUNTER — Inpatient Hospital Stay (HOSPITAL_COMMUNITY): Admission: RE | Admit: 2012-02-22 | Payer: PRIVATE HEALTH INSURANCE | Source: Ambulatory Visit

## 2012-02-27 ENCOUNTER — Ambulatory Visit (HOSPITAL_COMMUNITY): Payer: PRIVATE HEALTH INSURANCE | Admitting: Physical Therapy

## 2012-02-29 ENCOUNTER — Inpatient Hospital Stay (HOSPITAL_COMMUNITY): Admission: RE | Admit: 2012-02-29 | Payer: PRIVATE HEALTH INSURANCE | Source: Ambulatory Visit

## 2012-03-05 ENCOUNTER — Inpatient Hospital Stay (HOSPITAL_COMMUNITY): Admission: RE | Admit: 2012-03-05 | Payer: PRIVATE HEALTH INSURANCE | Source: Ambulatory Visit

## 2012-03-07 ENCOUNTER — Ambulatory Visit (HOSPITAL_COMMUNITY)
Admission: RE | Admit: 2012-03-07 | Discharge: 2012-03-07 | Disposition: A | Discharge status: Home / Self Care | Payer: PRIVATE HEALTH INSURANCE | Source: Ambulatory Visit | Attending: Orthopedic Surgery | Admitting: Orthopedic Surgery

## 2012-03-07 DIAGNOSIS — IMO0001 Reserved for inherently not codable concepts without codable children: Secondary | ICD-10-CM | POA: Insufficient documentation

## 2012-03-07 DIAGNOSIS — M25569 Pain in unspecified knee: Secondary | ICD-10-CM | POA: Insufficient documentation

## 2012-03-07 DIAGNOSIS — R262 Difficulty in walking, not elsewhere classified: Secondary | ICD-10-CM | POA: Insufficient documentation

## 2012-03-07 DIAGNOSIS — M6281 Muscle weakness (generalized): Secondary | ICD-10-CM | POA: Insufficient documentation

## 2012-03-07 NOTE — Progress Notes (Signed)
Physical Therapy Treatment Patient Details  Name: Tracey Mccarthy MRN: [id number] Date of Birth: 1966/01/23  Today's Date: 03/07/2012 Time: 4098-1191 PT Time Calculation (min): 53 min Charges: 59' NMR, 13' TE, 1 ice Visit#: 2  of 12   Re-eval: 03/15/12    Authorization: MEDICARE  Authorization Time Period:    Authorization Visit#: 2  of 10    Subjective: Symptoms/Limitations Symptoms: Pt reporst that she has been doing her exercises and her knees are still sore today.  States she is still loosing her balance.  States she fell on the floor yesterday when she was walking with her cane. Comes in with her rollator and is able to bend over safely to retrive her coat which fell on the floor.  Pain Assessment Currently in Pain?: Yes Pain Score:   9 Pain Location: Knee  Precautions/Restrictions  Precautions Precautions: Fall  Exercise/Treatments Aerobic Stationary Bike: 6' 1.0 for activity level Standing Heel Raises: 10 reps;Limitations Heel Raises Limitations: Toe Raises: 10 reps Lateral Step Up: Both;10 reps;Hand Hold: 0;Step Height: 2" Forward Step Up: Both;10 reps;Hand Hold: 0;Step Height: 2" Step Down: Both;10 reps;Hand Hold: 0;Step Height: 2" Functional Squat: 10 reps;Limitations Functional Squat Limitations: 7 reps w/partial squat 10 sec holds Standing Standing Eyes Opened: Narrow base of support (BOS);1 rep;Time;Head turns;Limitations Standing Eyes Opened Time: 60 Standing Eyes Opened Limitations: NBOS 1 min; NBOS w/15 head turns Standing Eyes Closed: Narrow base of support (BOS);1 rep;Time Standing Eyes Closed Time: 60 Tandem Stance: Eyes open;2 reps;30 secs Standing, One Foot on a Step: Eyes open;2 inch;3 reps;30 secs;Limitations (BLE) Standing, One Foot on a Step Limitations: w/gentle pertabations   Modalities Modalities: Cryotherapy Cryotherapy Number Minutes Cryotherapy: 10 Minutes Cryotherapy Location: Knee (bilateral)  Physical Therapy Assessment and  Plan PT Assessment and Plan Clinical Impression Statement: At beginning of session, pt comes in w/rollator and drops an object on floor and is able to easily reach down and pick up w/o UE A.  All exercises completed by mat today secondary to fall risk.  Has LOB when pt is not having a conversation and full recovery w/mod A from PT.  When engaged in conversation pt does not have LOB episodes.  and is able to maintain higher level balance.  PT Plan: FALL RISK.  perform all activities by a large mat w/gait belt to ensure safety.  BERG BALANCE TEST.   Progress when able balance exercises and strengthening in order to reach functional goals.       Goals    Problem List Patient Active Problem List  Diagnosis  . Chondromalacia of both patellae   Sandar Krinke, PT 03/07/2012, 9:33 AM

## 2012-03-19 ENCOUNTER — Telehealth: Payer: Self-pay | Admitting: Orthopedic Surgery

## 2012-03-19 NOTE — Telephone Encounter (Signed)
Patient advised of doctor's reply °

## 2012-03-19 NOTE — Telephone Encounter (Signed)
That is correct   I have no further treatment to offer  And no surgery is recommended

## 2012-03-19 NOTE — Telephone Encounter (Signed)
Tracey Mccarthy said she has completed her therapy and her knees are still hurting.  Asking for another appointment , but notes say no further reccommendations  for treatment.  Please advise  Tracey Mccarthy's phone # [phone]

## 2012-12-13 ENCOUNTER — Ambulatory Visit: Payer: PRIVATE HEALTH INSURANCE | Admitting: Neurology

## 2012-12-16 ENCOUNTER — Encounter: Payer: Self-pay | Admitting: *Deleted

## 2012-12-17 ENCOUNTER — Encounter: Payer: Self-pay | Admitting: Neurology

## 2012-12-17 ENCOUNTER — Ambulatory Visit (INDEPENDENT_AMBULATORY_CARE_PROVIDER_SITE_OTHER): Payer: PRIVATE HEALTH INSURANCE | Admitting: Neurology

## 2012-12-17 ENCOUNTER — Encounter (INDEPENDENT_AMBULATORY_CARE_PROVIDER_SITE_OTHER): Payer: Self-pay

## 2012-12-17 VITALS — BP 106/80 | HR 62 | Ht 62.5 in | Wt 236.0 lb

## 2012-12-17 DIAGNOSIS — H5310 Unspecified subjective visual disturbances: Secondary | ICD-10-CM | POA: Insufficient documentation

## 2012-12-17 DIAGNOSIS — H4720 Unspecified optic atrophy: Secondary | ICD-10-CM | POA: Insufficient documentation

## 2012-12-17 NOTE — Patient Instructions (Signed)
Blurred Vision °You have been seen today complaining of blurred vision. This means you have a loss of ability to see small details.  °CAUSES  °Blurred vision can be a symptom of underlying eye problems, such as: °· Aging of the eye (presbyopia). °· Glaucoma. °· Cataracts. °· Eye infection. °· Eye-related migraine. °· Diabetes mellitus. °· Fatigue. °· Migraine headaches. °· High blood pressure. °· Breakdown of the back of the eye (macular degeneration). °· Problems caused by some medications. °The most common cause of blurred vision is the need for eyeglasses or a new prescription. Today in the emergency department, no cause for your blurred vision can be found. °SYMPTOMS  °Blurred vision is the loss of visual sharpness and detail (acuity). °DIAGNOSIS  °Should blurred vision continue, you should see your caregiver. If your caregiver is your primary care physician, he or she may choose to refer you to another specialist.  °TREATMENT  °Do not ignore your blurred vision. Make sure to have it checked out to see if further treatment or referral is necessary. °SEEK MEDICAL CARE IF:  °You are unable to get into a specialist so we can help you with a referral. °SEEK IMMEDIATE MEDICAL CARE IF: °You have severe eye pain, severe headache, or sudden loss of vision. °MAKE SURE YOU:  °· Understand these instructions. °· Will watch your condition. °· Will get help right away if you are not doing well or get worse. °Document Released: 01/19/2003 Document Revised: 04/10/2011 Document Reviewed: 08/21/2007 °[employer]® Patient Information ©2014 [employer]. ° °
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
## 2012-12-18 ENCOUNTER — Telehealth: Payer: Self-pay | Admitting: Neurology

## 2012-12-18 LAB — SEDIMENTATION RATE: Sed Rate: 38 mm/hr — ABNORMAL HIGH (ref 0–32)

## 2012-12-18 LAB — ANA W/REFLEX: Anti Nuclear Antibody(ANA): NEGATIVE

## 2012-12-18 LAB — VITAMIN B12: Vitamin B-12: 524 pg/mL (ref 211–946)

## 2012-12-18 LAB — RPR: RPR: NONREACTIVE

## 2012-12-18 LAB — ANGIOTENSIN CONVERTING ENZYME: Angio Convert Enzyme: 21 U/L (ref 14–82)

## 2012-12-18 LAB — TSH: TSH: 1.37 u[IU]/mL (ref 0.450–4.500)

## 2012-12-18 NOTE — Telephone Encounter (Signed)
I called patient. The blood work was unremarkable with exception of the sedimentation rate was slightly elevated. This can be rechecked in the future. MRI the brain and the visual evoked response test is pending.

## 2012-12-25 ENCOUNTER — Ambulatory Visit (INDEPENDENT_AMBULATORY_CARE_PROVIDER_SITE_OTHER): Payer: PRIVATE HEALTH INSURANCE

## 2012-12-25 ENCOUNTER — Telehealth: Payer: Self-pay | Admitting: Neurology

## 2012-12-25 ENCOUNTER — Other Ambulatory Visit: Payer: Self-pay | Admitting: Neurology

## 2012-12-25 DIAGNOSIS — H5310 Unspecified subjective visual disturbances: Secondary | ICD-10-CM

## 2012-12-25 DIAGNOSIS — H4720 Unspecified optic atrophy: Secondary | ICD-10-CM
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
## 2012-12-25 NOTE — Telephone Encounter (Signed)
I called patient. The visual evoked response test was normal bilaterally. I will call her once I get the MRI brain results.

## 2013-01-02 ENCOUNTER — Other Ambulatory Visit: Payer: PRIVATE HEALTH INSURANCE

## 2013-01-06 ENCOUNTER — Other Ambulatory Visit: Payer: PRIVATE HEALTH INSURANCE

## 2013-01-08 ENCOUNTER — Telehealth: Payer: Self-pay | Admitting: Neurology

## 2013-01-08 ENCOUNTER — Ambulatory Visit
Admission: RE | Admit: 2013-01-08 | Discharge: 2013-01-08 | Disposition: A | Discharge status: Home / Self Care | Payer: PRIVATE HEALTH INSURANCE | Source: Ambulatory Visit | Attending: Neurology | Admitting: Neurology

## 2013-01-08 DIAGNOSIS — H5310 Unspecified subjective visual disturbances: Secondary | ICD-10-CM

## 2013-01-08 DIAGNOSIS — H4720 Unspecified optic atrophy: Secondary | ICD-10-CM

## 2013-01-08 DIAGNOSIS — R51 Headache: Secondary | ICD-10-CM

## 2013-01-08 NOTE — Telephone Encounter (Signed)
I called the patient. The CT scan of range is unremarkable. The patient cannot have MRI secondary to a spinal stimulator wire that is in place. Blood work was relatively unremarkable with exception of a sedimentation rate of 38, visual evoked responses was normal. There is no evidence of any optic nerve issues.

## 2014-04-22 ENCOUNTER — Telehealth: Payer: Self-pay

## 2014-04-22 NOTE — Telephone Encounter (Signed)
PATIENT RECEIVED LETTER TO SCHEDULE COLONOSCOPY   PLEASE CALL AT [phone]

## 2014-04-27 ENCOUNTER — Telehealth: Payer: Self-pay

## 2014-04-27 NOTE — Telephone Encounter (Signed)
See separate triage.  

## 2014-04-27 NOTE — Telephone Encounter (Signed)
MOVI PREP SPLIT DOSING- CLEAR LIQUIDS WITH BREAKFAST.  

## 2014-04-27 NOTE — Telephone Encounter (Signed)
Gastroenterology Pre-Procedure Review  Request Date: 04/27/2014 Requesting Physician:Dr. Sherril Croon   PATIENT REVIEW QUESTIONS: The patient responded to the following health history questions as indicated:    1. Diabetes Melitis: no 2. Joint replacements in the past 12 months: no 3. Major health problems in the past 3 months: no 4. Has an artificial valve or MVP: no 5. Has a defibrillator: no 6. Has been advised in past to take antibiotics in advance of a procedure like teeth cleaning: no    MEDICATIONS & ALLERGIES:    Patient reports the following regarding taking any blood thinners:   Plavix? no Aspirin? no Coumadin? no  Patient confirms/reports the following medications:  Current Outpatient Prescriptions  Medication Sig Dispense Refill  . Azilsartan Medoxomil 40 MG TABS Take 40 mg by mouth.    . carbamazepine (TEGRETOL) 200 MG tablet Take 200 mg by mouth 2 (two) times daily.    . chlorthalidone (HYGROTON) 25 MG tablet Take 25 mg by mouth daily.    . citalopram (CELEXA) 10 MG tablet Take 10 mg by mouth daily.    . fexofenadine (ALLEGRA) 180 MG tablet Take 180 mg by mouth daily.    . fluticasone (FLONASE) 50 MCG/ACT nasal spray Place 2 sprays into the nose daily.    Marland Kitchen omeprazole (PRILOSEC) 20 MG capsule Take 20 mg by mouth daily.    Marland Kitchen tiZANidine (ZANAFLEX) 4 MG capsule Take 4 mg by mouth 3 (three) times daily. Only as needed    . triamcinolone cream (KENALOG) 0.1 % Apply 0.1 application topically daily.    Marland Kitchen amLODipine (NORVASC) 5 MG tablet Take 5 mg by mouth daily.    Marland Kitchen atenolol (TENORMIN) 25 MG tablet Take 25 mg by mouth daily.    . Buprenorphine (BUTRANS) 15 MCG/HR PTWK Place 1 patch onto the skin once a week. Patient change on Monday    . furosemide (LASIX) 40 MG tablet Take 40 mg by mouth daily.    Marland Kitchen gabapentin (NEURONTIN) 600 MG tablet Take 600 mg by mouth 3 (three) times daily.    Marland Kitchen HYDROcodone-acetaminophen (NORCO) 7.5-325 MG per tablet Take 7.5 tablets by mouth 2 (two) times  daily.    Marland Kitchen LYRICA 50 MG capsule Take 50 mg by mouth daily.    . meclizine (ANTIVERT) 25 MG tablet Take 1 tablet (25 mg total) by mouth 3 (three) times daily as needed. (Patient not taking: Reported on 04/27/2014) 21 tablet 0  . naproxen (NAPROSYN) 500 MG tablet Take 1 tablet (500 mg total) by mouth 2 (two) times daily with a meal. (Patient not taking: Reported on 04/27/2014) 20 tablet 0  . oxyCODONE (ROXICODONE) 15 MG immediate release tablet Take 15 mg by mouth every 4 (four) hours as needed. pain    . potassium chloride (K-DUR) 10 MEQ tablet Take 10 mEq by mouth daily.    . RESTASIS 0.05 % ophthalmic emulsion Place 0.05 drops into both eyes daily.    . sertraline (ZOLOFT) 50 MG tablet Take 50 mg by mouth daily.    . traZODone (DESYREL) 100 MG tablet Take 100 mg by mouth at bedtime.     No current facility-administered medications for this visit.    Patient confirms/reports the following allergies:  Allergies  Allergen Reactions  . Codeine Other (See Comments)    Hallucinate     No orders of the defined types were placed in this encounter.    AUTHORIZATION INFORMATION Primary Insurance:   ID #:   Group #:  Pre-Cert / Berkley Harvey  required:  Pre-Cert / Auth #:   Secondary Insurance:   ID #:  Group #:  Pre-Cert / Auth required: Pre-Cert / Auth #:   SCHEDULE INFORMATION: Procedure has been scheduled as follows:  Date:             Time:   Location:   This Gastroenterology Pre-Precedure Review Form is being routed to the following provider(s): Jonette Eva, MD

## 2014-04-28 NOTE — Telephone Encounter (Signed)
LMOM for a return call to get appt.

## 2014-05-04 ENCOUNTER — Other Ambulatory Visit: Payer: Self-pay

## 2014-05-04 DIAGNOSIS — Z1211 Encounter for screening for malignant neoplasm of colon: Secondary | ICD-10-CM

## 2014-05-04 MED ORDER — PEG-KCL-NACL-NASULF-NA ASC-C 100 G PO SOLR: 1.0000 | ORAL | Status: DC

## 2014-05-04 NOTE — Telephone Encounter (Signed)
Pt is scheduled for 05/18/2014 at 11:15 AM with Dr. Darrick Penna.

## 2014-05-04 NOTE — Telephone Encounter (Signed)
Rx sent to the pharmacy and instructions mailed to pt.  

## 2014-05-07 ENCOUNTER — Telehealth: Payer: Self-pay | Admitting: Gastroenterology

## 2014-05-07 NOTE — Telephone Encounter (Signed)
Spoke to pt she is aware of appt for colonoscopy and she does not have to have OV first.

## 2014-05-07 NOTE — Telephone Encounter (Signed)
LMOM to call.

## 2014-05-07 NOTE — Telephone Encounter (Signed)
Patient called this morning. Returning call to DS. I told her DS was on the other line and I would have to take a message. Please call her back at [phone]

## 2014-05-07 NOTE — Telephone Encounter (Signed)
Patient called this morning asking when her appointment was. I asked her was it for an OV and she said yes.  I told her she wasn't on the schedule for an OV, but she was on the schedule with SF on 4/18 for a procedure. She said, OK and hung up.  You might want to call her back to clarify date, time and prep.

## 2014-05-07 NOTE — Telephone Encounter (Signed)
Spoke to pt and she is aware that she does not have to come in for office visit prior to her TCS. She received her info for the colonoscopy.

## 2014-05-07 NOTE — Telephone Encounter (Signed)
Tried to call pt. Could not leave a VM. She is scheduled for colonoscopy on 05/18/2014 at 11:15 AM with Dr. Darrick Penna and will need to be at [facility] at 10:15 AM. Instructions have been mailed to her.

## 2014-05-13 ENCOUNTER — Telehealth: Payer: Self-pay

## 2014-05-13 NOTE — Telephone Encounter (Signed)
I called [employer] @ [phone] and spoke to Griffin C who said that a PA is not required for Screening colonoscopy in network.

## 2014-05-18 ENCOUNTER — Encounter (HOSPITAL_COMMUNITY)
Admission: RE | Disposition: A | Discharge status: Home / Self Care | Payer: Self-pay | Source: Ambulatory Visit | Attending: Gastroenterology

## 2014-05-18 ENCOUNTER — Encounter (HOSPITAL_COMMUNITY): Payer: Self-pay

## 2014-05-18 ENCOUNTER — Ambulatory Visit (HOSPITAL_COMMUNITY)
Admission: RE | Admit: 2014-05-18 | Discharge: 2014-05-18 | Disposition: A | Discharge status: Home / Self Care | Payer: Medicare Other | Source: Ambulatory Visit | Attending: Gastroenterology | Admitting: Gastroenterology

## 2014-05-18 DIAGNOSIS — K219 Gastro-esophageal reflux disease without esophagitis: Secondary | ICD-10-CM | POA: Insufficient documentation

## 2014-05-18 DIAGNOSIS — Z6841 Body Mass Index (BMI) 40.0 and over, adult: Secondary | ICD-10-CM | POA: Diagnosis not present

## 2014-05-18 DIAGNOSIS — M069 Rheumatoid arthritis, unspecified: Secondary | ICD-10-CM | POA: Insufficient documentation

## 2014-05-18 DIAGNOSIS — H469 Unspecified optic neuritis: Secondary | ICD-10-CM | POA: Diagnosis not present

## 2014-05-18 DIAGNOSIS — Z1211 Encounter for screening for malignant neoplasm of colon: Secondary | ICD-10-CM | POA: Insufficient documentation

## 2014-05-18 DIAGNOSIS — Z886 Allergy status to analgesic agent status: Secondary | ICD-10-CM | POA: Insufficient documentation

## 2014-05-18 DIAGNOSIS — Z90710 Acquired absence of both cervix and uterus: Secondary | ICD-10-CM | POA: Insufficient documentation

## 2014-05-18 DIAGNOSIS — K644 Residual hemorrhoidal skin tags: Secondary | ICD-10-CM | POA: Diagnosis not present

## 2014-05-18 DIAGNOSIS — E669 Obesity, unspecified: Secondary | ICD-10-CM | POA: Diagnosis not present

## 2014-05-18 DIAGNOSIS — I10 Essential (primary) hypertension: Secondary | ICD-10-CM | POA: Insufficient documentation

## 2014-05-18 HISTORY — PX: COLONOSCOPY: SHX5424

## 2014-05-18 SURGERY — COLONOSCOPY
Anesthesia: Moderate Sedation

## 2014-05-18 MED ORDER — MEPERIDINE HCL 100 MG/ML IJ SOLN
INTRAMUSCULAR | Status: DC | PRN
  Administered 2014-05-18 (×3): 25 mg via INTRAVENOUS

## 2014-05-18 MED ORDER — SODIUM CHLORIDE 0.9 % IJ SOLN
INTRAMUSCULAR | Status: AC
  Filled 2014-05-18: qty 3

## 2014-05-18 MED ORDER — PROMETHAZINE HCL 25 MG/ML IJ SOLN
INTRAMUSCULAR | Status: DC | PRN
  Administered 2014-05-18: 12.5 mg via INTRAVENOUS

## 2014-05-18 MED ORDER — LIDOCAINE VISCOUS 2 % MT SOLN
OROMUCOSAL | Status: AC
  Filled 2014-05-18: qty 15

## 2014-05-18 MED ORDER — MIDAZOLAM HCL 5 MG/5ML IJ SOLN
INTRAMUSCULAR | Status: DC | PRN
Start: 2014-05-18 — End: 2014-05-18
  Administered 2014-05-18: 2 mg via INTRAVENOUS
  Administered 2014-05-18: 1 mg via INTRAVENOUS
  Administered 2014-05-18: 2 mg via INTRAVENOUS

## 2014-05-18 MED ORDER — SODIUM CHLORIDE 0.9 % IV SOLN
INTRAVENOUS | Status: DC
  Administered 2014-05-18: 10:00:00 via INTRAVENOUS

## 2014-05-18 MED ORDER — STERILE WATER FOR IRRIGATION IR SOLN
Status: DC | PRN
  Administered 2014-05-18: 10:00:00

## 2014-05-18 MED ORDER — PROMETHAZINE HCL 25 MG/ML IJ SOLN
INTRAMUSCULAR | Status: AC
  Filled 2014-05-18: qty 1

## 2014-05-18 MED ORDER — MEPERIDINE HCL 100 MG/ML IJ SOLN
INTRAMUSCULAR | Status: AC
  Filled 2014-05-18: qty 2

## 2014-05-18 MED ORDER — MIDAZOLAM HCL 5 MG/5ML IJ SOLN
INTRAMUSCULAR | Status: AC
  Filled 2014-05-18: qty 10

## 2014-05-18 NOTE — Discharge Instructions (Signed)
You have EXTERNAL HEMORRHOIDS. YOU DID NOT HAVE ANY POLYPS.    CONTINUE YOUR WEIGHT LOSS EFFORTS.  FOLLOW A HIGH FIBER DIET. AVOID ITEMS THAT CAUSE BLOATING. SEE INFO BELOW.  USE PREPARATION H 2 TO 4 TIMES A DAY AS NEEDED FOR RECTAL PRESSURE/PAIN/BLEEDING.  Next colonoscopy in 10 years.   Colonoscopy Care After Read the instructions outlined below and refer to this sheet in the next week. These discharge instructions provide you with general information on caring for yourself after you leave the hospital. While your treatment has been planned according to the most current medical practices available, unavoidable complications occasionally occur. If you have any problems or questions after discharge, call DR. Marissah Vandemark, [phone].  ACTIVITY  You may resume your regular activity, but move at a slower pace for the next 24 hours.   Take frequent rest periods for the next 24 hours.   Walking will help get rid of the air and reduce the bloated feeling in your belly (abdomen).   No driving for 24 hours (because of the medicine (anesthesia) used during the test).   You may shower.   Do not sign any important legal documents or operate any machinery for 24 hours (because of the anesthesia used during the test).    NUTRITION  Drink plenty of fluids.   You may resume your normal diet as instructed by your doctor.   Begin with a light meal and progress to your normal diet. Heavy or fried foods are harder to digest and may make you feel sick to your stomach (nauseated).   Avoid alcoholic beverages for 24 hours or as instructed.    MEDICATIONS  You may resume your normal medications.   WHAT YOU CAN EXPECT TODAY  Some feelings of bloating in the abdomen.   Passage of more gas than usual.   Spotting of blood in your stool or on the toilet paper  .  IF YOU HAD POLYPS REMOVED DURING THE COLONOSCOPY:  Eat a soft diet IF YOU HAVE NAUSEA, BLOATING, ABDOMINAL PAIN, OR  VOMITING.    FINDING OUT THE RESULTS OF YOUR TEST Not all test results are available during your visit. DR. Darrick Penna WILL CALL YOU WITHIN 7 DAYS OF YOUR PROCEDUE WITH YOUR RESULTS. Do not assume everything is normal if you have not heard from DR. Fawaz Borquez IN ONE WEEK, CALL HER OFFICE AT [phone].  SEEK IMMEDIATE MEDICAL ATTENTION AND CALL THE OFFICE: [phone] IF:  You have more than a spotting of blood in your stool.   Your belly is swollen (abdominal distention).   You are nauseated or vomiting.   You have a temperature over 101F.   You have abdominal pain or discomfort that is severe or gets worse throughout the day.  High-Fiber Diet A high-fiber diet changes your normal diet to include more whole grains, legumes, fruits, and vegetables. Changes in the diet involve replacing refined carbohydrates with unrefined foods. The calorie level of the diet is essentially unchanged. The Dietary Reference Intake (recommended amount) for adult males is 38 grams per day. For adult females, it is 25 grams per day. Pregnant and lactating women should consume 28 grams of fiber per day. Fiber is the intact part of a plant that is not broken down during digestion. Functional fiber is fiber that has been isolated from the plant to provide a beneficial effect in the body. PURPOSE  Increase stool bulk.   Ease and regulate bowel movements.   Lower cholesterol.  INDICATIONS THAT YOU NEED MORE  FIBER  Constipation and hemorrhoids.   Uncomplicated diverticulosis (intestine condition) and irritable bowel syndrome.   Weight management.   As a protective measure against hardening of the arteries (atherosclerosis), diabetes, and cancer.   GUIDELINES FOR INCREASING FIBER IN THE DIET  Start adding fiber to the diet slowly. A gradual increase of about 5 more grams (2 slices of whole-wheat bread, 2 servings of most fruits or vegetables, or 1 bowl of high-fiber cereal) per day is best. Too rapid an  increase in fiber may result in constipation, flatulence, and bloating.   Drink enough water and fluids to keep your urine clear or pale yellow. Water, juice, or caffeine-free drinks are recommended. Not drinking enough fluid may cause constipation.   Eat a variety of high-fiber foods rather than one type of fiber.   Try to increase your intake of fiber through using high-fiber foods rather than fiber pills or supplements that contain small amounts of fiber.   The goal is to change the types of food eaten. Do not supplement your present diet with high-fiber foods, but replace foods in your present diet.  INCLUDE A VARIETY OF FIBER SOURCES  Replace refined and processed grains with whole grains, canned fruits with fresh fruits, and incorporate other fiber sources. White rice, white breads, and most bakery goods contain little or no fiber.   Brown whole-grain rice, buckwheat oats, and many fruits and vegetables are all good sources of fiber. These include: broccoli, Brussels sprouts, cabbage, cauliflower, beets, sweet potatoes, white potatoes (skin on), carrots, tomatoes, eggplant, squash, berries, fresh fruits, and dried fruits.   Cereals appear to be the richest source of fiber. Cereal fiber is found in whole grains and bran. Bran is the fiber-rich outer coat of cereal grain, which is largely removed in refining. In whole-grain cereals, the bran remains. In breakfast cereals, the largest amount of fiber is found in those with "bran" in their names. The fiber content is sometimes indicated on the label.   You may need to include additional fruits and vegetables each day.   In baking, for 1 cup white flour, you may use the following substitutions:   1 cup whole-wheat flour minus 2 tablespoons.   1/2 cup white flour plus 1/2 cup whole-wheat flour.   Hemorrhoids Hemorrhoids are dilated (enlarged) veins around the rectum. Sometimes clots will form in the veins. This makes them swollen and  painful. These are called thrombosed hemorrhoids. Causes of hemorrhoids include:  Constipation.   Straining to have a bowel movement.   HEAVY LIFTING HOME CARE INSTRUCTIONS  Eat a well balanced diet and drink 6 to 8 glasses of water every day to avoid constipation. You may also use a bulk laxative.   Avoid straining to have bowel movements.   Keep anal area dry and clean.   Do not use a donut shaped pillow or sit on the toilet for long periods. This increases blood pooling and pain.   Move your bowels when your body has the urge; this will require less straining and will decrease pain and pressure.

## 2014-05-18 NOTE — H&P (Signed)
Primary Care Physician:  Glenda Chroman., MD Primary Gastroenterologist:  Dr. Oneida Alar  Pre-Procedure History & Physical: HPI:  Tracey Mccarthy is a 49 y.o. female here for [place].  Past Medical History  Diagnosis Date  . Hypertension   . Optic neuritis   . Headache   . Rheumatoid arthritis   . Obese   . Degenerative arthritis   . GERD (gastroesophageal reflux disease)     Past Surgical History  Procedure Laterality Date  . Abdominal hysterectomy    . Back surgery    . Knee surgery Left   . Arm fracture surgery Left   . Tibia fracture surgery Bilateral     Prior to Admission medications   Medication Sig Start Date End Date Taking? Authorizing Provider  amLODipine (NORVASC) 5 MG tablet Take 5 mg by mouth daily. 11/29/12  Yes Historical Provider, MD  atenolol (TENORMIN) 25 MG tablet Take 25 mg by mouth daily.   Yes Historical Provider, MD  Azilsartan Medoxomil 40 MG TABS Take 40 mg by mouth.   Yes Historical Provider, MD  Buprenorphine (BUTRANS) 15 MCG/HR PTWK Place 1 patch onto the skin once a week. Patient change on Monday   Yes Historical Provider, MD  carbamazepine (TEGRETOL) 200 MG tablet Take 200 mg by mouth 2 (two) times daily.   Yes Historical Provider, MD  chlorthalidone (HYGROTON) 25 MG tablet Take 25 mg by mouth daily.   Yes Historical Provider, MD  citalopram (CELEXA) 10 MG tablet Take 10 mg by mouth daily.   Yes Historical Provider, MD  fexofenadine (ALLEGRA) 180 MG tablet Take 180 mg by mouth daily.   Yes Historical Provider, MD  fluticasone (FLONASE) 50 MCG/ACT nasal spray Place 2 sprays into the nose daily.   Yes Historical Provider, MD  furosemide (LASIX) 40 MG tablet Take 40 mg by mouth daily.   Yes Historical Provider, MD  gabapentin (NEURONTIN) 600 MG tablet Take 600 mg by mouth 3 (three) times daily.   Yes Historical Provider, MD  HYDROcodone-acetaminophen (NORCO) 7.5-325 MG per tablet Take 7.5 tablets by mouth 2 (two) times daily. 09/17/12  Yes  Historical Provider, MD  omeprazole (PRILOSEC) 20 MG capsule Take 20 mg by mouth daily.   Yes Historical Provider, MD  oxyCODONE (ROXICODONE) 15 MG immediate release tablet Take 15 mg by mouth every 4 (four) hours as needed. pain   Yes Historical Provider, MD  peg 3350 powder (MOVIPREP) 100 G SOLR Take 1 kit (200 g total) by mouth as directed. 05/04/14  Yes Sandi L Fields, MD  RESTASIS 0.05 % ophthalmic emulsion Place 0.05 drops into both eyes daily. 12/05/12  Yes Historical Provider, MD  tiZANidine (ZANAFLEX) 4 MG capsule Take 4 mg by mouth 3 (three) times daily. Only as needed   Yes Historical Provider, MD  traZODone (DESYREL) 100 MG tablet Take 100 mg by mouth at bedtime.   Yes Historical Provider, MD  LYRICA 50 MG capsule Take 50 mg by mouth daily. 09/13/12   Historical Provider, MD  meclizine (ANTIVERT) 25 MG tablet Take 1 tablet (25 mg total) by mouth 3 (three) times daily as needed. Patient not taking: Reported on 04/27/2014 02/08/12   Lily Kocher, PA-C  naproxen (NAPROSYN) 500 MG tablet Take 1 tablet (500 mg total) by mouth 2 (two) times daily with a meal. Patient not taking: Reported on 04/27/2014 01/01/12   Tammy Triplett, PA-C  potassium chloride (K-DUR) 10 MEQ tablet Take 10 mEq by mouth daily. 11/01/12   Historical Provider, MD  sertraline (ZOLOFT) 50 MG tablet Take 50 mg by mouth daily. 11/15/12   Historical Provider, MD  triamcinolone cream (KENALOG) 0.1 % Apply 0.1 application topically daily. 09/12/12   Historical Provider, MD    Allergies as of 05/04/2014 - Review Complete 05/04/2014  Allergen Reaction Noted  . Codeine Other (See Comments) 01/01/2012    Family History  Problem Relation Age of Onset  . Pneumonia Father   . Diabetes Father   . Hypertension Father   . Heart attack Father     History   Social History  . Marital Status: Married    Spouse Name: N/A  . Number of Children: 2  . Years of Education: college   Occupational History  .     Social History Main  Topics  . Smoking status: Never Smoker   . Smokeless tobacco: Never Used  . Alcohol Use: No  . Drug Use: No  . Sexual Activity: Yes    Birth Control/ Protection: Surgical   Other Topics Concern  . Not on file   Social History Narrative    Review of Systems: See HPI, otherwise negative ROS   Physical Exam: BP 101/35 mmHg  Temp(Src) 97.7 F (36.5 C) (Oral)  Resp 19  Ht $R'5\' 2"'sQ$  (1.575 m)  Wt 229 lb (103.874 kg)  BMI 41.87 kg/m2 General:   Alert,  pleasant and cooperative in NAD Head:  Normocephalic and atraumatic. Neck:  Supple; Lungs:  Clear throughout to auscultation.    Heart:  Regular rate and rhythm. Abdomen:  Soft, nontender and nondistended. Normal bowel sounds, without guarding, and without rebound.   Neurologic:  Alert and  oriented x4;  grossly normal neurologically.  Impression/Plan:     SCREENING  Plan:  1. TCS TODAY

## 2014-05-18 NOTE — Op Note (Signed)
[facility] [address] [place], [address]   COLONOSCOPY PROCEDURE REPORT  PATIENT: Tracey Mccarthy, Tracey Mccarthy  MR#: [id number] BIRTHDATE: 1965/07/19 , 48  yrs. old GENDER: female ENDOSCOPIST: West Bali, MD REFERRED [id number] Sherril Croon, M.D. PROCEDURE DATE:  2014/05/31 PROCEDURE:   Colonoscopy, screening INDICATIONS:average risk patient for colon cancer. MEDICATIONS: Promethazine (Phenergan) 12.5 mg IV, Demerol 75 mg IV, and Versed 5 mg IV  DESCRIPTION OF PROCEDURE:    Physical exam was performed.  Informed consent was obtained from the patient after explaining the benefits, risks, and alternatives to procedure.  The patient was connected to monitor and placed in left lateral position. Continuous oxygen was provided by nasal cannula and IV medicine administered through an indwelling cannula.  After administration of sedation and rectal exam, the patients rectum was intubated and the EC-[id number] ([id number])  colonoscope was advanced under direct visualization to the ileum.  The scope was removed slowly by carefully examining the color, texture, anatomy, and integrity mucosa on the way out.  The patient was recovered in endoscopy and discharged home in satisfactory condition.    COLON FINDINGS: The examined terminal ileum appeared to be normal. , The colonic mucosa appeared normal.  , and Moderate sized external hemorrhoids were found.  PREP QUALITY: good.  CECAL W/D TIME: 10       minutes COMPLICATIONS: None  ENDOSCOPIC IMPRESSION: 1.   The examined terminal ileum appeared to be normal 2.   The colonic mucosa appeared normal 3.   Moderate sized external hemorrhoids  RECOMMENDATIONS: CONTINUE WEIGHT LOSS EFFORTS. FOLLOW A HIGH FIBER DIET. USE PREPARATION H 2 TO 4 TIMES A DAY AS NEEDED FOR RECTAL PRESSURE/PAIN/BLEEDING. Next colonoscopy in 10 years.      _______________________________ Rosalie DoctorWest Bali, MD 31-May-2014 2:03 PM   CPT CODES: ICD  CODES:  The ICD and CPT codes recommended by this software are interpretations from the data that the clinical staff has captured with the software.  The verification of the translation of this report to the ICD and CPT codes and modifiers is the sole responsibility of the health care institution and practicing physician where this report was generated.  [employer] will not be held responsible for the validity of the ICD and CPT codes included on this report.  AMA assumes no liability for data contained or not contained herein. CPT is a Publishing rights manager of the [employer].

## 2014-05-20 ENCOUNTER — Encounter (HOSPITAL_COMMUNITY): Payer: Self-pay | Admitting: Gastroenterology

## 2014-05-29 ENCOUNTER — Telehealth: Payer: Self-pay | Admitting: General Practice

## 2014-05-29 NOTE — Telephone Encounter (Signed)
Routing to [place] for repeat tcs

## 2014-05-29 NOTE — Telephone Encounter (Signed)
Reminder in epic °

## 2014-05-29 NOTE — Telephone Encounter (Signed)
Patient called in wanting to know her results from her tcs.  I read her the recommendations from Dr. Darrick Penna that was given to her on the day of her procedure, please see recommendations below.   RECOMMENDATIONS: CONTINUE WEIGHT LOSS EFFORTS. FOLLOW A HIGH FIBER DIET. USE PREPARATION H 2 TO 4 TIMES A DAY AS NEEDED FOR RECTAL PRESSURE/PAIN/BLEEDING. Next colonoscopy in 10 years.

## 2015-07-23 ENCOUNTER — Encounter (HOSPITAL_COMMUNITY): Payer: Self-pay | Admitting: Emergency Medicine

## 2015-07-23 ENCOUNTER — Other Ambulatory Visit: Payer: Self-pay

## 2015-07-23 ENCOUNTER — Emergency Department (HOSPITAL_COMMUNITY)
Admission: EM | Admit: 2015-07-23 | Discharge: 2015-07-23 | Disposition: A | Discharge status: Home / Self Care | Payer: Medicare Other | Attending: Emergency Medicine | Admitting: Emergency Medicine

## 2015-07-23 ENCOUNTER — Emergency Department (HOSPITAL_COMMUNITY): Payer: Medicare Other

## 2015-07-23 DIAGNOSIS — R079 Chest pain, unspecified: Secondary | ICD-10-CM | POA: Diagnosis present

## 2015-07-23 DIAGNOSIS — M069 Rheumatoid arthritis, unspecified: Secondary | ICD-10-CM | POA: Insufficient documentation

## 2015-07-23 DIAGNOSIS — E876 Hypokalemia: Secondary | ICD-10-CM | POA: Insufficient documentation

## 2015-07-23 DIAGNOSIS — I10 Essential (primary) hypertension: Secondary | ICD-10-CM | POA: Diagnosis not present

## 2015-07-23 DIAGNOSIS — Z79899 Other long term (current) drug therapy: Secondary | ICD-10-CM | POA: Insufficient documentation

## 2015-07-23 LAB — CBC
HCT: 35.4 % — ABNORMAL LOW (ref 36.0–46.0)
Hemoglobin: 11.3 g/dL — ABNORMAL LOW (ref 12.0–15.0)
MCH: 22.2 pg — ABNORMAL LOW (ref 26.0–34.0)
MCHC: 31.9 g/dL (ref 30.0–36.0)
MCV: 69.5 fL — ABNORMAL LOW (ref 78.0–100.0)
Platelets: 324 10*3/uL (ref 150–400)
RBC: 5.09 MIL/uL (ref 3.87–5.11)
RDW: 15.3 % (ref 11.5–15.5)
WBC: 7.4 10*3/uL (ref 4.0–10.5)

## 2015-07-23 LAB — COMPREHENSIVE METABOLIC PANEL
ALT: 16 U/L (ref 14–54)
AST: 21 U/L (ref 15–41)
Albumin: 3.8 g/dL (ref 3.5–5.0)
Alkaline Phosphatase: 102 U/L (ref 38–126)
Anion gap: 10 (ref 5–15)
BUN: 15 mg/dL (ref 6–20)
CO2: 30 mmol/L (ref 22–32)
Calcium: 9 mg/dL (ref 8.9–10.3)
Chloride: 97 mmol/L — ABNORMAL LOW (ref 101–111)
Creatinine, Ser: 1 mg/dL (ref 0.44–1.00)
GFR calc Af Amer: >60 mL/min (ref >60)
GFR calc non Af Amer: >60 mL/min (ref >60)
Glucose, Bld: 117 mg/dL — ABNORMAL HIGH (ref 65–99)
Potassium: 2.6 mmol/L — CL (ref 3.5–5.1)
Sodium: 137 mmol/L (ref 135–145)
Total Bilirubin: 0.4 mg/dL (ref 0.3–1.2)
Total Protein: 8.1 g/dL (ref 6.5–8.1)

## 2015-07-23 LAB — TROPONIN I: Troponin I: <0.03 ng/mL (ref <0.031)

## 2015-07-23 MED ORDER — ONDANSETRON HCL 4 MG/2ML IJ SOLN
4.0000 mg | Freq: Once | INTRAMUSCULAR | Status: AC
  Administered 2015-07-23: 4 mg via INTRAVENOUS
  Filled 2015-07-23: qty 2

## 2015-07-23 MED ORDER — POTASSIUM CHLORIDE CRYS ER 20 MEQ PO TBCR
60.0000 meq | EXTENDED_RELEASE_TABLET | Freq: Once | ORAL | Status: AC
  Administered 2015-07-23: 60 meq via ORAL
  Filled 2015-07-23: qty 3

## 2015-07-23 MED ORDER — POTASSIUM CHLORIDE 10 MEQ/100ML IV SOLN
10.0000 meq | INTRAVENOUS | Status: AC
  Administered 2015-07-23 (×2): 10 meq via INTRAVENOUS
  Filled 2015-07-23 (×2): qty 100

## 2015-07-23 MED ORDER — MORPHINE SULFATE (PF) 4 MG/ML IV SOLN
4.0000 mg | Freq: Once | INTRAVENOUS | Status: AC
  Administered 2015-07-23: 4 mg via INTRAVENOUS
  Filled 2015-07-23: qty 1

## 2015-07-23 MED ORDER — KETOROLAC TROMETHAMINE 30 MG/ML IJ SOLN
15.0000 mg | Freq: Once | INTRAMUSCULAR | Status: AC
  Administered 2015-07-23: 15 mg via INTRAVENOUS
  Filled 2015-07-23: qty 1

## 2015-07-23 NOTE — ED Notes (Signed)
Patient arrives via EMS from [facility] with c/o central chest pain that started shortly after eating a hotdog. Patient given ASA 324mg  and Nitro in route to ED.

## 2015-07-23 NOTE — ED Notes (Signed)
CRITICAL VALUE ALERT  Critical value received: Potassium 2.6  Date of notification:  07/23/2015  Time of notification:  1622  Critical value read back:Yes  Nurse who received alert:  Laury Deep, RN  MD notified (1st page):  Dr. Juleen China

## 2015-07-23 NOTE — Discharge Instructions (Signed)
Nonspecific Chest Pain  °Chest pain can be caused by many different conditions. There is always a chance that your pain could be related to something serious, such as a heart attack or a blood clot in your lungs. Chest pain can also be caused by conditions that are not life-threatening. If you have chest pain, it is very important to follow up with your health care provider. °CAUSES  °Chest pain can be caused by: °· Heartburn. °· Pneumonia or bronchitis. °· Anxiety or stress. °· Inflammation around your heart (pericarditis) or lung (pleuritis or pleurisy). °· A blood clot in your lung. °· A collapsed lung (pneumothorax). It can develop suddenly on its own (spontaneous pneumothorax) or from trauma to the chest. °· Shingles infection (varicella-zoster virus). °· Heart attack. °· Damage to the bones, muscles, and cartilage that make up your chest wall. This can include: °¨ Bruised bones due to injury. °¨ Strained muscles or cartilage due to frequent or repeated coughing or overwork. °¨ Fracture to one or more ribs. °¨ Sore cartilage due to inflammation (costochondritis). °RISK FACTORS  °Risk factors for chest pain may include: °· Activities that increase your risk for trauma or injury to your chest. °· Respiratory infections or conditions that cause frequent coughing. °· Medical conditions or overeating that can cause heartburn. °· Heart disease or family history of heart disease. °· Conditions or health behaviors that increase your risk of developing a blood clot. °· Having had chicken pox (varicella zoster). °SIGNS AND SYMPTOMS °Chest pain can feel like: °· Burning or tingling on the surface of your chest or deep in your chest. °· Crushing, pressure, aching, or squeezing pain. °· Dull or sharp pain that is worse when you move, cough, or take a deep breath. °· Pain that is also felt in your back, neck, shoulder, or arm, or pain that spreads to any of these areas. °Your chest pain may come and go, or it may stay  constant. °DIAGNOSIS °Lab tests or other studies may be needed to find the cause of your pain. Your health care provider may have you take a test called an ambulatory ECG (electrocardiogram). An ECG records your heartbeat patterns at the time the test is performed. You may also have other tests, such as: °· Transthoracic echocardiogram (TTE). During echocardiography, sound waves are used to create a picture of all of the heart structures and to look at how blood flows through your heart. °· Transesophageal echocardiogram (TEE). This is a more advanced imaging test that obtains images from inside your body. It allows your health care provider to see your heart in finer detail. °· Cardiac monitoring. This allows your health care provider to monitor your heart rate and rhythm in real time. °· Holter monitor. This is a portable device that records your heartbeat and can help to diagnose abnormal heartbeats. It allows your health care provider to track your heart activity for several days, if needed. °· Stress tests. These can be done through exercise or by taking medicine that makes your heart beat more quickly. °· Blood tests. °· Imaging tests. °TREATMENT  °Your treatment depends on what is causing your chest pain. Treatment may include: °· Medicines. These may include: °¨ Acid blockers for heartburn. °¨ Anti-inflammatory medicine. °¨ Pain medicine for inflammatory conditions. °¨ Antibiotic medicine, if an infection is present. °¨ Medicines to dissolve blood clots. °¨ Medicines to treat coronary artery disease. °· Supportive care for conditions that do not require medicines. This may include: °¨ Resting. °¨ Applying heat   or cold packs to injured areas. °¨ Limiting activities until pain decreases. °HOME CARE INSTRUCTIONS °· If you were prescribed an antibiotic medicine, finish it all even if you start to feel better. °· Avoid any activities that bring on chest pain. °· Do not use any tobacco products, including  cigarettes, chewing tobacco, or electronic cigarettes. If you need help quitting, ask your health care provider. °· Do not drink alcohol. °· Take medicines only as directed by your health care provider. °· Keep all follow-up visits as directed by your health care provider. This is important. This includes any further testing if your chest pain does not go away. °· If heartburn is the cause for your chest pain, you may be told to keep your head raised (elevated) while sleeping. This reduces the chance that acid will go from your stomach into your esophagus. °· Make lifestyle changes as directed by your health care provider. These may include: °¨ Getting regular exercise. Ask your health care provider to suggest some activities that are safe for you. °¨ Eating a heart-healthy diet. A registered dietitian can help you to learn healthy eating options. °¨ Maintaining a healthy weight. °¨ Managing diabetes, if necessary. °¨ Reducing stress. °SEEK MEDICAL CARE IF: °· Your chest pain does not go away after treatment. °· You have a rash with blisters on your chest. °· You have a fever. °SEEK IMMEDIATE MEDICAL CARE IF:  °· Your chest pain is worse. °· You have an increasing cough, or you cough up blood. °· You have severe abdominal pain. °· You have severe weakness. °· You faint. °· You have chills. °· You have sudden, unexplained chest discomfort. °· You have sudden, unexplained discomfort in your arms, back, neck, or jaw. °· You have shortness of breath at any time. °· You suddenly start to sweat, or your skin gets clammy. °· You feel nauseous or you vomit. °· You suddenly feel light-headed or dizzy. °· Your heart begins to beat quickly, or it feels like it is skipping beats. °These symptoms may represent a serious problem that is an emergency. Do not wait to see if the symptoms will go away. Get medical help right away. Call your local emergency services (911 in the [place] Do not drive yourself to the hospital. °  °This  information is not intended to replace advice given to you by your health care provider. Make sure you discuss any questions you have with your health care provider. °  °Document Released: 10/26/2004 Document Revised: 02/06/2014 Document Reviewed: 08/22/2013 °Elsevier Interactive Patient Education ©2016 [employer] ° °

## 2015-07-23 NOTE — ED Provider Notes (Signed)
CSN: [id number]     Arrival date & time 07/23/15  1402 History   First MD Initiated Contact with Patient 07/23/15 1453     Chief Complaint  Patient presents with  . Chest Pain     (Consider location/radiation/quality/duration/timing/severity/associated sxs/prior Treatment) HPI   50 year old female with chest pain. Central to epigastric. Onset shortly after eating a hot dog. She does not feel like he got stuck or hunger though. She has drank since then without any difficulty. His been constant since onset.No respiratory complaints. No unusual leg pain or swelling. No nausea or vomiting.  Past Medical History  Diagnosis Date  . Hypertension   . Optic neuritis   . Headache   . Rheumatoid arthritis (HCC)   . Obese   . Degenerative arthritis   . GERD (gastroesophageal reflux disease)    Past Surgical History  Procedure Laterality Date  . Abdominal hysterectomy    . Back surgery    . Knee surgery Left   . Arm fracture surgery Left   . Tibia fracture surgery Bilateral   . Colonoscopy N/A 05/18/2014    Procedure: COLONOSCOPY;  Surgeon: West Bali, MD;  Location: [facility];  Service: Endoscopy;  Laterality: N/A;  11:15 AM   Family History  Problem Relation Age of Onset  . Pneumonia Father   . Diabetes Father   . Hypertension Father   . Heart attack Father    Social History  Substance Use Topics  . Smoking status: Never Smoker   . Smokeless tobacco: Never Used  . Alcohol Use: No   OB History    No data available     Review of Systems  All systems reviewed and negative, other than as noted in HPI.   Allergies  Review of patient's allergies indicates no known allergies.  Home Medications   Prior to Admission medications   Medication Sig Start Date End Date Taking? Authorizing Provider  carbamazepine (TEGRETOL) 200 MG tablet Take 200 mg by mouth 2 (two) times daily.   Yes Historical Provider, MD  fluticasone (FLONASE) 50 MCG/ACT nasal spray Place 2 sprays  into the nose daily.   Yes Historical Provider, MD  furosemide (LASIX) 40 MG tablet Take 40 mg by mouth daily.   Yes Historical Provider, MD  tiZANidine (ZANAFLEX) 4 MG capsule Take 4 mg by mouth 3 (three) times daily as needed for muscle spasms.    Yes Historical Provider, MD  amLODipine (NORVASC) 5 MG tablet Take 5 mg by mouth daily. 11/29/12   Historical Provider, MD  Azilsartan Medoxomil 40 MG TABS Take 40 mg by mouth.    Historical Provider, MD  chlorthalidone (HYGROTON) 25 MG tablet Take 25 mg by mouth daily.    Historical Provider, MD  citalopram (CELEXA) 10 MG tablet Take 10 mg by mouth daily.    Historical Provider, MD  gabapentin (NEURONTIN) 600 MG tablet Take 600 mg by mouth 3 (three) times daily.    Historical Provider, MD  LYRICA 50 MG capsule Take 50 mg by mouth daily. 09/13/12   Historical Provider, MD  omeprazole (PRILOSEC) 20 MG capsule Take 20 mg by mouth daily.    Historical Provider, MD  potassium chloride (K-DUR) 10 MEQ tablet Take 10 mEq by mouth daily. 11/01/12   Historical Provider, MD  sertraline (ZOLOFT) 50 MG tablet Take 50 mg by mouth daily. 11/15/12   Historical Provider, MD  traZODone (DESYREL) 100 MG tablet Take 100 mg by mouth at bedtime.    Historical Provider,  MD  triamcinolone cream (KENALOG) 0.1 % Apply 0.1 application topically daily. 09/12/12   Historical Provider, MD   BP 136/87 mmHg  Pulse 80  Temp(Src) 97.8 F (36.6 C) (Oral)  Resp 20  Ht 5\' 1"  (1.549 m)  Wt 230 lb (104.327 kg)  BMI 43.48 kg/m2  SpO2 98% Physical Exam  Constitutional: She appears well-developed and well-nourished. No distress.  HENT:  Head: Normocephalic and atraumatic.  Eyes: Conjunctivae are normal. Right eye exhibits no discharge. Left eye exhibits no discharge.  Neck: Neck supple.  Cardiovascular: Normal rate, regular rhythm and normal heart sounds.  Exam reveals no gallop and no friction rub.   No murmur heard. Pulmonary/Chest: Effort normal and breath sounds normal. No  respiratory distress. She exhibits no tenderness.  Abdominal: Soft. She exhibits no distension. There is no tenderness.  Musculoskeletal: She exhibits no edema or tenderness.  Neurological: She is alert.  Skin: Skin is warm and dry.  Psychiatric: She has a normal mood and affect. Her behavior is normal. Thought content normal.  Nursing note and vitals reviewed.   ED Course  Procedures (including critical care time) Labs Review Labs Reviewed  CBC - Abnormal; Notable for the following:    Hemoglobin 11.3 (*)    HCT 35.4 (*)    MCV 69.5 (*)    MCH 22.2 (*)    All other components within normal limits  COMPREHENSIVE METABOLIC PANEL - Abnormal; Notable for the following:    Potassium 2.6 (*)    Chloride 97 (*)    Glucose, Bld 117 (*)    All other components within normal limits  TROPONIN I    Imaging Review Dg Chest 2 View  07/23/2015  CLINICAL DATA:  Chest pain which began after eating a hot dog today. EXAM: CHEST  2 VIEW COMPARISON:  PA and lateral chest 10/05/2010. FINDINGS: There is cardiomegaly without edema. Lungs are clear. Hiatal hernia is noted. No pneumothorax or pleural effusion. IMPRESSION: Cardiomegaly without acute disease. Hiatal hernia. Electronically Signed   By: 12/05/2010 M.D.   On: 07/23/2015 15:41   I have personally reviewed and evaluated these images and lab results as part of my medical decision-making.   EKG Interpretation None      MDM   Final diagnoses:  Chest pain, unspecified chest pain type  Hypokalemia    50 year old female with chest pain which sounds very atypical for ACS. Doubt PE, dissection, serious infection or other emergent process. She was incidentally noted to be hypokalemic. She was given supplementation. She is feeling better currently and I feel she is appropriate for discharge. Return precautions were discussed.    44, MD 07/29/15 1208

## 2016-01-03 ENCOUNTER — Encounter: Payer: Self-pay | Admitting: Gastroenterology

## 2016-01-17 ENCOUNTER — Ambulatory Visit (INDEPENDENT_AMBULATORY_CARE_PROVIDER_SITE_OTHER): Payer: Medicare Other | Admitting: Gastroenterology

## 2016-01-17 ENCOUNTER — Encounter: Payer: Self-pay | Admitting: Gastroenterology

## 2016-01-17 ENCOUNTER — Other Ambulatory Visit: Payer: Self-pay

## 2016-01-17 DIAGNOSIS — R1030 Lower abdominal pain, unspecified: Secondary | ICD-10-CM | POA: Insufficient documentation

## 2016-01-17 DIAGNOSIS — R112 Nausea with vomiting, unspecified: Secondary | ICD-10-CM

## 2016-01-17 DIAGNOSIS — R1013 Epigastric pain: Secondary | ICD-10-CM | POA: Diagnosis not present

## 2016-01-17 DIAGNOSIS — K219 Gastro-esophageal reflux disease without esophagitis: Secondary | ICD-10-CM | POA: Insufficient documentation

## 2016-01-17 DIAGNOSIS — R1310 Dysphagia, unspecified: Secondary | ICD-10-CM

## 2016-01-17 NOTE — Patient Instructions (Addendum)
1. Upper endoscopy with Dr. Fields. See separate instructions.  

## 2016-01-17 NOTE — Progress Notes (Addendum)
REVIEWED-NO ADDITIONAL RECOMMENDATIONS.  Primary Care Physician:  Ignatius Specking, MD  Primary Gastroenterologist:  Jonette Eva, MD   Chief Complaint  Patient presents with  . Nausea    Zofran doesn't help, worse after eating, going on for 3 months-getting worse  . Emesis  . Dysphagia    has to eat something after taking meds, had esophagus dilated in 05/2014 (Dr. Teena Dunk in [place])    HPI:  Tracey Mccarthy is a 50 y.o. female here At the request of PCP for further evaluation of nausea and vomiting. Patient also complains of dysphagia.  Six month history of intermittent vomiting. Most days. No weight loss. Heartburn controlled on omeprazole for past two months. Solid food and pill dysphagia. Feels like getting stuck. No odynophagia. No constipation, diarrhea, melena, brbpr. Some soreness after vomiting.   Patient is a history of microcytic anemia dating back to 2012. Paternal aunt - sickle cell anemia. Patient denies history of sickle cell trait. Last colonoscopy April 2016, moderate sized external hemorrhoids. She did well with conscious sedation time. Otherwise unremarkable including normal terminal ileum. Next colonoscopy planned for 2026.   Current Outpatient Prescriptions  Medication Sig Dispense Refill  . carbamazepine (TEGRETOL) 200 MG tablet Take 400 mg by mouth at bedtime.     . chlorthalidone (HYGROTON) 25 MG tablet Take 25 mg by mouth daily.    . citalopram (CELEXA) 10 MG tablet Take 10 mg by mouth at bedtime.     . fluticasone (FLONASE) 50 MCG/ACT nasal spray Place 2 sprays into the nose daily.    Marland Kitchen omeprazole (PRILOSEC) 20 MG capsule Take 40 mg by mouth daily.     . ondansetron (ZOFRAN) 4 MG tablet Take 4 mg by mouth as needed.    Marland Kitchen tiZANidine (ZANAFLEX) 4 MG capsule Take 4 mg by mouth 2 (two) times daily as needed for muscle spasms.      No current facility-administered medications for this visit.     Allergies as of 01/17/2016  . (No Known Allergies)    Past Medical  History:  Diagnosis Date  . Degenerative arthritis   . GERD (gastroesophageal reflux disease)   . Headache   . Hypertension   . Obese   . Optic neuritis   . Rheumatoid arthritis [facility])     Past Surgical History:  Procedure Laterality Date  . ABDOMINAL HYSTERECTOMY    . arm surgery Right    knot removed  . COLONOSCOPY N/A 05/18/2014   Dr. Darrick Penna: Moderate sized external hemorrhoids, normal terminal ileum and colon. Next colonoscopy 2026  . KNEE SURGERY Left     Family History  Problem Relation Age of Onset  . Pneumonia Father   . Diabetes Father   . Hypertension Father   . Heart attack Father   . Colon cancer Father     greater than 60  . Colon cancer Paternal Aunt     greater than 39    Social History   Social History  . Marital status: Married    Spouse name: N/A  . Number of children: 2  . Years of education: college   Occupational History  .  Unemployed   Social History Main Topics  . Smoking status: Never Smoker  . Smokeless tobacco: Never Used  . Alcohol use No  . Drug use: No  . Sexual activity: Yes    Birth control/ protection: Surgical   Other Topics Concern  . Not on file   Social History Narrative  . No  narrative on file      ROS:  General: Negative for anorexia, weight loss, fever, chills, fatigue, weakness. Eyes: Negative for vision changes.  ENT: Negative for hoarseness,nasal congestion.See history of present illness CV: Negative for chest pain, angina, palpitations, dyspnea on exertion, peripheral edema.  Respiratory: Negative for dyspnea at rest, dyspnea on exertion, cough, sputum, wheezing.  GI: See history of present illness. GU:  Negative for dysuria, hematuria, urinary incontinence, urinary frequency, nocturnal urination.  MS: Negative for joint pain, low back pain.  Derm: Negative for rash or itching.  Neuro: Negative for weakness, abnormal sensation, seizure, frequent headaches, memory loss, confusion.  Psych: Negative for  anxiety, depression, suicidal ideation, hallucinations.  Endo: Negative for unusual weight change.  Heme: Negative for bruising or bleeding. Allergy: Negative for rash or hives.    Physical Examination:  BP (!) 164/94   Pulse 72   Temp 97.6 F (36.4 C) (Oral)   Ht 5\' 2"  (1.575 m)   Wt 255 lb 3.2 oz (115.8 kg)   BMI 46.68 kg/m    General: Well-nourished, well-developed in no acute distress.  Head: Normocephalic, atraumatic.   Eyes: Conjunctiva pink, no icterus. Mouth: Oropharyngeal mucosa moist and pink , no lesions erythema or exudate. Neck: Supple without thyromegaly, masses, or lymphadenopathy.  Lungs: Clear to auscultation bilaterally.  Heart: Regular rate and rhythm, no murmurs rubs or gallops.  Abdomen: Bowel sounds are normal, nontender, nondistended, no hepatosplenomegaly or masses, no abdominal bruits or    hernia , no rebound or guarding.   Rectal: Not performed Extremities: No lower extremity edema. No clubbing or deformities.  Neuro: Alert and oriented x 4 , grossly normal neurologically.  Skin: Warm and dry, no rash or jaundice.   Psych: Alert and cooperative, normal mood and affect.  Labs: Lab Results  Component Value Date   CREATININE 1.00 07/23/2015   BUN 15 07/23/2015   NA 137 07/23/2015   K 2.6 (LL) 07/23/2015   CL 97 (L) 07/23/2015   CO2 30 07/23/2015   Lab Results  Component Value Date   ALT 16 07/23/2015   AST 21 07/23/2015   ALKPHOS 102 07/23/2015   BILITOT 0.4 07/23/2015   Lab Results  Component Value Date   WBC 7.4 07/23/2015   HGB 11.3 (L) 07/23/2015   HCT 35.4 (L) 07/23/2015   MCV 69.5 (L) 07/23/2015   PLT 324 07/23/2015     Lab Results  Component Value Date   VITAMINB12 524 12/17/2012   No results found for: FOLATE   Imaging Studies: No results found.

## 2016-01-18 ENCOUNTER — Ambulatory Visit (HOSPITAL_COMMUNITY)
Admission: RE | Admit: 2016-01-18 | Discharge: 2016-01-18 | Disposition: A | Discharge status: Home / Self Care | Payer: Medicare Other | Source: Ambulatory Visit | Attending: Gastroenterology | Admitting: Gastroenterology

## 2016-01-18 ENCOUNTER — Other Ambulatory Visit: Payer: Self-pay

## 2016-01-18 ENCOUNTER — Encounter (HOSPITAL_COMMUNITY): Payer: Self-pay | Admitting: *Deleted

## 2016-01-18 ENCOUNTER — Telehealth: Payer: Self-pay | Admitting: Gastroenterology

## 2016-01-18 ENCOUNTER — Encounter: Payer: Self-pay | Admitting: Gastroenterology

## 2016-01-18 ENCOUNTER — Encounter (HOSPITAL_COMMUNITY)
Admission: RE | Disposition: A | Discharge status: Home / Self Care | Payer: Self-pay | Source: Ambulatory Visit | Attending: Gastroenterology

## 2016-01-18 DIAGNOSIS — R112 Nausea with vomiting, unspecified: Secondary | ICD-10-CM | POA: Diagnosis not present

## 2016-01-18 DIAGNOSIS — Z7951 Long term (current) use of inhaled steroids: Secondary | ICD-10-CM | POA: Diagnosis not present

## 2016-01-18 DIAGNOSIS — I10 Essential (primary) hypertension: Secondary | ICD-10-CM | POA: Diagnosis not present

## 2016-01-18 DIAGNOSIS — K449 Diaphragmatic hernia without obstruction or gangrene: Secondary | ICD-10-CM | POA: Insufficient documentation

## 2016-01-18 DIAGNOSIS — Z6841 Body Mass Index (BMI) 40.0 and over, adult: Secondary | ICD-10-CM | POA: Diagnosis not present

## 2016-01-18 DIAGNOSIS — E669 Obesity, unspecified: Secondary | ICD-10-CM | POA: Insufficient documentation

## 2016-01-18 DIAGNOSIS — R1310 Dysphagia, unspecified: Secondary | ICD-10-CM | POA: Diagnosis not present

## 2016-01-18 DIAGNOSIS — M069 Rheumatoid arthritis, unspecified: Secondary | ICD-10-CM | POA: Diagnosis not present

## 2016-01-18 DIAGNOSIS — K317 Polyp of stomach and duodenum: Secondary | ICD-10-CM

## 2016-01-18 DIAGNOSIS — Z79899 Other long term (current) drug therapy: Secondary | ICD-10-CM | POA: Diagnosis not present

## 2016-01-18 DIAGNOSIS — R110 Nausea: Secondary | ICD-10-CM

## 2016-01-18 DIAGNOSIS — R1110 Vomiting, unspecified: Secondary | ICD-10-CM

## 2016-01-18 HISTORY — PX: ESOPHAGOGASTRODUODENOSCOPY: SHX5428

## 2016-01-18 HISTORY — PX: BIOPSY: SHX5522

## 2016-01-18 HISTORY — PX: SAVORY DILATION: SHX5439

## 2016-01-18 SURGERY — EGD (ESOPHAGOGASTRODUODENOSCOPY)
Anesthesia: Moderate Sedation

## 2016-01-18 MED ORDER — STERILE WATER FOR IRRIGATION IR SOLN
Status: DC | PRN
  Administered 2016-01-18: 14:00:00

## 2016-01-18 MED ORDER — LIDOCAINE VISCOUS 2 % MT SOLN
OROMUCOSAL | Status: AC
  Filled 2016-01-18: qty 15

## 2016-01-18 MED ORDER — PROMETHAZINE HCL 25 MG/ML IJ SOLN
12.5000 mg | Freq: Once | INTRAMUSCULAR | Status: AC
  Administered 2016-01-18: 12.5 mg via INTRAVENOUS

## 2016-01-18 MED ORDER — SODIUM CHLORIDE 0.9 % IV SOLN
INTRAVENOUS | Status: DC
  Administered 2016-01-18: 13:00:00 via INTRAVENOUS

## 2016-01-18 MED ORDER — SODIUM CHLORIDE 0.9% FLUSH
INTRAVENOUS | Status: AC
  Filled 2016-01-18: qty 10

## 2016-01-18 MED ORDER — MEPERIDINE HCL 100 MG/ML IJ SOLN
INTRAMUSCULAR | Status: AC
  Filled 2016-01-18: qty 2

## 2016-01-18 MED ORDER — PROMETHAZINE HCL 25 MG/ML IJ SOLN
INTRAMUSCULAR | Status: AC
  Filled 2016-01-18: qty 1

## 2016-01-18 MED ORDER — MEPERIDINE HCL 100 MG/ML IJ SOLN
INTRAMUSCULAR | Status: DC | PRN
  Administered 2016-01-18 (×2): 25 mg via INTRAVENOUS
  Administered 2016-01-18: 50 mg via INTRAVENOUS

## 2016-01-18 MED ORDER — MIDAZOLAM HCL 5 MG/5ML IJ SOLN
INTRAMUSCULAR | Status: DC | PRN
  Administered 2016-01-18: 1 mg via INTRAVENOUS
  Administered 2016-01-18 (×2): 2 mg via INTRAVENOUS

## 2016-01-18 MED ORDER — MINERAL OIL PO OIL
TOPICAL_OIL | ORAL | Status: AC
  Filled 2016-01-18: qty 30

## 2016-01-18 MED ORDER — MIDAZOLAM HCL 5 MG/5ML IJ SOLN
INTRAMUSCULAR | Status: AC
  Filled 2016-01-18: qty 10

## 2016-01-18 MED ORDER — LIDOCAINE VISCOUS 2 % MT SOLN
OROMUCOSAL | Status: DC | PRN
  Administered 2016-01-18: 3 mL via OROMUCOSAL

## 2016-01-18 NOTE — Telephone Encounter (Signed)
COMPLETE THE UGI SERIES,Dx: POSTPRANDIAL NAUSEA/VOMITING, LARGE HIATAL HERNIA.

## 2016-01-18 NOTE — Assessment & Plan Note (Addendum)
50 year old female with six-month history of progressive intermittent nausea and vomiting, solid food and pill dysphagia. Typical reflux is been well controlled over the last 2 months when her PPI was reinitiated. Other symptoms have not improved. She denies NSAID or aspirin use. Definitely postprandial component but denies abdominal pain making biliary disease less likely. Differential diagnosis includes complicated GERD, esophageal stricture, gastritis, peptic ulcer disease. Recommend EGD plus or minus esophageal dilation in the near future with Dr. Darrick Penna.  I have discussed the risks, alternatives, benefits with regards to but not limited to the risk of reaction to medication, bleeding, infection, perforation and the patient is agreeable to proceed. Written consent to be obtained. We will augment conscious sedation with Phenergan 12.5 mg IV 30 minutes before the procedure.

## 2016-01-18 NOTE — Progress Notes (Signed)
CC'ED TO PCP 

## 2016-01-18 NOTE — Discharge Instructions (Signed)
YOUR NAUSEA AND VOMITING IS DUE TO REFLUX AND A LARGE HIATAL HERNIA. You have MILD gastritis AND SMALL STOMACH POLYPS. I biopsied your stomach. I STRETCHED YOUR ESOPHAGUS BUT DID NOT SEE A DEFINITE STRICTURE.   CONTINUE YOUR WEIGHT LOSS EFFORTS. YOU SHOULD LOSE 50 POUNDS.   EAT 4 TO 6 SMALL MEALS A DAY.  FOLLOW A LOW FAT/SOFT MECHANICAL DIET DIET. MEATS SHOULD BE BAKED, BROILED, OR BOILED.  MEATS SHOULD BE CHOPPED OR GROUND ONLY. AVOID FRIED FOODS. DO NOT EAT CHUNKS OF ANYTHING. SEE INFO BELOW.  AVOID TRIGGERS FOR REFLUX SEE INFO BELOW.  TAKE OMEPRAZOLE 30 MINUTES PRIOR TO YOUR FIRST MEAL DAILY.  COMPLETE THE UGI SERIES TO ASSESS YOUR HIATAL HERNIA.  BACK TO WORK Thursday DEC 21.  FOLLOW UP IN 4 MOS.  UPPER ENDOSCOPY AFTER CARE Read the instructions outlined below and refer to this sheet in the next week. These discharge instructions provide you with general information on caring for yourself after you leave the hospital. While your treatment has been planned according to the most current medical practices available, unavoidable complications occasionally occur. If you have any problems or questions after discharge, call DR. Jalia Zuniga, [phone].  ACTIVITY  You may resume your regular activity, but move at a slower pace for the next 24 hours.   Take frequent rest periods for the next 24 hours.   Walking will help get rid of the air and reduce the bloated feeling in your belly (abdomen).   No driving for 24 hours (because of the medicine (anesthesia) used during the test).   You may shower.   Do not sign any important legal documents or operate any machinery for 24 hours (because of the anesthesia used during the test).    NUTRITION  Drink plenty of fluids.   You may resume your normal diet as instructed by your doctor.   Begin with a light meal and progress to your normal diet. Heavy or fried foods are harder to digest and may make you feel sick to your stomach  (nauseated).   Avoid alcoholic beverages for 24 hours or as instructed.    MEDICATIONS  You may resume your normal medications.   WHAT YOU CAN EXPECT TODAY  Some feelings of bloating in the abdomen.   Passage of more gas than usual.    IF YOU HAD A BIOPSY TAKEN DURING THE UPPER ENDOSCOPY:  Eat a soft diet IF YOU HAVE NAUSEA, BLOATING, ABDOMINAL PAIN, OR VOMITING.    FINDING OUT THE RESULTS OF YOUR TEST Not all test results are available during your visit. DR. Darrick Penna WILL CALL YOU WITHIN 14 DAYS OF YOUR PROCEDUE WITH YOUR RESULTS. Do not assume everything is normal if you have not heard from DR. Burnell Matlin, CALL HER OFFICE AT [phone].  SEEK IMMEDIATE MEDICAL ATTENTION AND CALL THE OFFICE: [phone] IF:  You have more than a spotting of blood in your stool.   Your belly is swollen (abdominal distention).   You are nauseated or vomiting.   You have a temperature over 101F.   You have abdominal pain or discomfort that is severe or gets worse throughout the day.   Lifestyle and home remedies TO CONTROL REFLUX  You may eliminate or reduce the frequency of heartburn by making the following lifestyle changes:   Control your weight. Being overweight is a major risk factor for heartburn and GERD. Excess pounds put pressure on your abdomen, pushing up your stomach and causing acid to back up into your esophagus.  Eat smaller meals. 4 TO 6 MEALS A DAY. This reduces pressure on the lower esophageal sphincter, helping to prevent the valve from opening and acid from washing back into your esophagus.    Loosen your belt. Clothes that fit tightly around your waist put pressure on your abdomen and the lower esophageal sphincter.    Eliminate heartburn triggers. Everyone has specific triggers. Common triggers such as fatty or fried foods, spicy food, tomato sauce, carbonated beverages, alcohol, chocolate, mint, garlic, onion, caffeine and nicotine may make heartburn worse.     Avoid stooping or bending FOR AT LEAST ONE HOUR AFTER EATING. Tying your shoes is OK. Bending over for longer periods to weed your garden isn't, especially soon after eating.    Don't lie down after a meal. Wait at least three to four hours after eating before going to bed, and don't lie down right after eating.    Alternative medicine  Several home remedies exist for treating GERD, but they provide only temporary relief. They include drinking baking soda (sodium bicarbonate) added to water or drinking other fluids such as baking soda mixed with cream of tartar and water.   Although these liquids create temporary relief by neutralizing, washing away or buffering acids, eventually they aggravate the situation by adding gas and fluid to your stomach, increasing pressure and causing more acid reflux. Further, adding more sodium to your diet may increase your blood pressure and add stress to your heart, and excessive bicarbonate ingestion can alter the acid-base balance in your body.    SOFT MECHANICAL DIET This SOFT MECHANICAL DIET is restricted to:  Foods that are moist, soft-textured, and easy to chew and swallow.   Meats that are ground or are minced no larger than one-quarter inch pieces. Meats are moist with gravy or sauce added.   Foods that do not include bread or bread-like textures except soft pancakes, well-moistened with syrup or sauce.   Textures with some chewing ability required.   Casseroles without rice.   Cooked vegetables that are less than half an inch in size and easily mashed with a fork. No cooked corn, peas, broccoli, cauliflower, cabbage, Brussels sprouts, asparagus, or other fibrous, non-tender or rubbery cooked vegetables.   Canned fruit except for pineapple. Fruit must be cut into pieces no larger than half an inch in size.   Foods that do not include nuts, seeds, coconut, or sticky textures.   FOOD TEXTURES FOR DYSPHAGIA DIET LEVEL 2 -SOFT MECHANICAL  DIET (includes all foods on Dysphagia Diet Level 1 - Pureed, in addition to the foods listed below)  FOOD GROUP: Breads. RECOMMENDED: Soft pancakes, well-moistened with syrup or sauce.  AVOID: All others.  FOOD GROUP: Cereals.  RECOMMENDED: Cooked cereals with little texture, including oatmeal. Unprocessed wheat bran stirred into cereals for bulk. Note: If thin liquids are restricted, it is important that all of the liquid is absorbed into the cereal.  AVOID: All dry cereals and any cooked cereals that may contain flax seeds or other seeds or nuts. Whole-grain, dry, or coarse cereals. Cereals with nuts, seeds, dried fruit, and/or coconut.  FOOD GROUP: Desserts. RECOMMENDED: Pudding, custard. Soft fruit pies with bottom crust only. Canned fruit (excluding pineapple). Soft, moist cakes with icing.Frozen malts, milk shakes, frozen yogurt, eggnog, nutritional supplements, ice cream, sherbet, regular or sugar-free gelatin, or any foods that become thin liquid at either room (70 F) or body temperature (98 F).  AVOID: Dry, coarse cakes and cookies. Anything with nuts, seeds, coconut, pineapple,  or dried fruit. Breakfast yogurt with nuts. Rice or bread pudding.  FOOD GROUP: Fats. RECOMMENDED: Butter, margarine, cream for cereal (depending on liquid consistency recommendations), gravy, cream sauces, sour cream, sour cream dips with soft additives, mayonnaise, salad dressings, cream cheese, cream cheese spreads with soft additives, whipped toppings.  AVOID: All fats with coarse or chunky additives.  FOOD GROUP: Fruits. RECOMMENDED: Soft drained, canned, or cooked fruits without seeds or skin. Fresh soft and ripe banana. Fruit juices with a small amount of pulp. If thin liquids are restricted, fruit juices should be thickened to appropriate consistency.  AVOID: Fresh or frozen fruits. Cooked fruit with skin or seeds. Dried fruits. Fresh, canned, or cooked pineapple.  FOOD GROUP: Meats and Meat  Substitutes. (Meat pieces should not exceed 1/4 of an inch cube and should be tender.) RECOMMENDED: Moistened ground or cooked meat, poultry, or fish. Moist ground or tender meat may be served with gravy or sauce. Casseroles without rice. Moist macaroni and cheese, well-cooked pasta with meat sauce, tuna noodle casserole, soft, moist lasagna. Moist meatballs, meatloaf, or fish loaf. Protein salads, such as tuna or egg without large chunks, celery, or onion. Cottage cheese, smooth quiche without large chunks. Poached, scrambled, or soft-cooked eggs (egg yolks should not be runny but should be moist and able to be mashed with butter, margarine, or other moisture added to them). (Cook eggs to 160 F or use pasteurized eggs for safety.) Souffls may have small, soft chunks. Tofu. Well-cooked, slightly mashed, moist legumes, such as baked beans. All meats or protein substitutes should be served with sauces or moistened to help maintain cohesiveness in the oral cavity.  AVOID: Dry meats, tough meats (such as bacon, sausage, hot dogs, bratwurst). Dry casseroles or casseroles with rice or large chunks. Peanut butter. Cheese slices and cubes. Hard-cooked or crisp fried eggs. Sandwiches.Pizza.  FOOD GROUP: Potatoes and Starches. RECOMMENDED: Well-cooked, moistened, boiled, baked, or mashed potatoes. Well-cooked shredded hash brown potatoes that are not crisp. (All potatoes need to be moist and in sauces.)Well-cooked noodles in sauce. Spaetzel or soft dumplings that have been moistened with butter or gravy.  AVOID: Potato skins and chips. Fried or French-fried potatoes. Rice.  FOOD GROUP: Soups. RECOMMENDED: Soups with easy-to-chew or easy-to-swallow meats or vegetables: Particle sizes in soups should be less than 1/2 inch. Soups will need to be thickened to appropriate consistency if soup is thinner than prescribed liquid consistency.  AVOID: Soups with large chunks of meat and vegetables. Soups with rice,  corn, peas.  FOOD GROUP: Vegetables. RECOMMENDED: All soft, well-cooked vegetables. Vegetables should be less than a half inch. Should be easily mashed with a fork.  AVOID: Cooked corn and peas. Broccoli, cabbage, Brussels sprouts, asparagus, or other fibrous, non-tender or rubbery cooked vegetables.  FOOD GROUP: Miscellaneous. RECOMMENDED: Jams and preserves without seeds, jelly. Sauces, salsas, etc., that may have small tender chunks less than 1/2 inch. Soft, smooth chocolate bars that are easily chewed.  AVOID: Seeds, nuts, coconut, or sticky foods. Chewy candies such as caramels or licorice.

## 2016-01-18 NOTE — Telephone Encounter (Signed)
UGI Series scheduled for 01/28/16 at 9:00 am at [facility], arrive at 8:45 am. NPO after midnight. Tried to call pt to inform, no answer and voicemail full unable to leave message. Letter mailed.

## 2016-01-18 NOTE — Progress Notes (Signed)
Patient rated throat pain 9 to 10 on scale after procedure.  Dr. Darrick Penna notified.  Patient not complaining of pain with Dr. Darrick Penna with post op instructions.

## 2016-01-18 NOTE — Progress Notes (Signed)
While waiting for [facility] the patient stated she threw up a small amount in bathroom but had flushed before noted by the nurse.  Patient described as small amount and brownish colored.  Denied bright red or clots.  Stated she has nausea at home and does this at home prn.  Instructed the patient the scope may have caused some irritation and if she continues and/or turns bright red and clots to go immediately to the Er.  Patient and husband verbalized understanding.  No s/s of distress.  Patient continues to sip on tea.  No complaints of pain.

## 2016-01-18 NOTE — Progress Notes (Signed)
Patient rates pain 6 to 7 on scale with discharge.  No facial grimacing noted.  Sipping tea from [employer] with no difficulty.  Family at side waiting for [facility]ATS.

## 2016-01-18 NOTE — H&P (Addendum)
Primary Care Physician:  Ignatius Specking, MD Primary Gastroenterologist:  Dr. Darrick Penna  Pre-Procedure History & Physical: HPI:  Tracey Mccarthy is a 50 y.o. female here for DYSPEPSIA, POSTPRANDIAL NAUSEA/VOMITING, DYSPHAGIA.  Past Medical History:  Diagnosis Date  . Degenerative arthritis   . GERD (gastroesophageal reflux disease)   . Headache   . Hypertension   . Obese   . Optic neuritis   . Rheumatoid arthritis [facility])     Past Surgical History:  Procedure Laterality Date  . ABDOMINAL HYSTERECTOMY    . arm surgery Right    knot removed  . COLONOSCOPY N/A 05/18/2014   Dr. Darrick Penna: Moderate sized external hemorrhoids, normal terminal ileum and colon. Next colonoscopy 2026  . KNEE SURGERY Left     Prior to Admission medications   Medication Sig Start Date End Date Taking? Authorizing Provider  carbamazepine (TEGRETOL) 200 MG tablet Take 400 mg by mouth at bedtime.    Yes Historical Provider, MD  chlorthalidone (HYGROTON) 25 MG tablet Take 25 mg by mouth daily.   Yes Historical Provider, MD  citalopram (CELEXA) 10 MG tablet Take 10 mg by mouth at bedtime.    Yes Historical Provider, MD  fluticasone (FLONASE) 50 MCG/ACT nasal spray Place 2 sprays into the nose daily.   Yes Historical Provider, MD  omeprazole (PRILOSEC) 20 MG capsule Take 40 mg by mouth daily.    Yes Historical Provider, MD  ondansetron (ZOFRAN) 4 MG tablet Take 4 mg by mouth as needed. 01/06/16  Yes Historical Provider, MD  tiZANidine (ZANAFLEX) 4 MG capsule Take 4 mg by mouth 2 (two) times daily as needed for muscle spasms.    Yes Historical Provider, MD    Allergies as of 01/17/2016  . (No Known Allergies)    Family History  Problem Relation Age of Onset  . Pneumonia Father   . Diabetes Father   . Hypertension Father   . Heart attack Father   . Colon cancer Father     greater than 60  . Colon cancer Paternal Aunt     greater than 28    Social History   Social History  . Marital status: Married   Spouse name: N/A  . Number of children: 2  . Years of education: college   Occupational History  .  Unemployed   Social History Main Topics  . Smoking status: Never Smoker  . Smokeless tobacco: Never Used  . Alcohol use No  . Drug use: No  . Sexual activity: Yes    Birth control/ protection: Surgical   Other Topics Concern  . Not on file   Social History Narrative  . No narrative on file    Review of Systems: See HPI, otherwise negative ROS   Physical Exam: BP (!) 155/83   Pulse 68   Temp 97.9 F (36.6 C) (Oral)   Resp 20   Ht 5\' 2"  (1.575 m)   Wt 255 lb (115.7 kg)   SpO2 100%   BMI 46.64 kg/m  General:   Alert,  pleasant and cooperative in NAD Head:  Normocephalic and atraumatic. Neck:  Supple; Lungs:  Clear throughout to auscultation.    Heart:  Regular rate and rhythm. Abdomen:  Soft, nontender and nondistended. Normal bowel sounds, without guarding, and without rebound.   Neurologic:  Alert and  oriented x4;  grossly normal neurologically.  Impression/Plan:     DYSPEPSIA-nausea/vomiting  PLAN:  EGD/? dil TODAY. DISCUSSED PROCEDURE, BENEFITS, & RISKS: < 1% chance of medication  reaction, bleeding, or perforation.

## 2016-01-18 NOTE — Op Note (Signed)
[facility] Patient Name: Tracey Mccarthy Procedure Date: 01/18/2016 1:22 PM MRN: [id number] Date of Birth: 1965-05-21 Attending MD: Jonette Eva , MD CSN: [id number] Age: 50 Admit Type: Outpatient Procedure:                Upper GI endoscopy WITH COLD FORCEPS                            BIOPSY/ESOPHAGEAL DILATION Indications:              Dysphagia, Nausea with vomiting-APR 2016 229 LBS                            AND NOW IN DEC 2017 255 LBS. Providers:                Jonette Eva, MD, Criselda Peaches. Patsy Lager, RN, Lollie Marrow.                            [place], Pensions consultant Referring MD:             Ignatius Specking Medicines:                Promethazine 12.5 mg IV, Meperidine 100 mg IV,                            Midazolam 5 mg IV Complications:            No immediate complications. Estimated Blood Loss:     Estimated blood loss was minimal. Procedure:                Pre-Anesthesia Assessment:                           - Prior to the procedure, a History and Physical                            was performed, and patient medications and                            allergies were reviewed. The patient's tolerance of                            previous anesthesia was also reviewed. The risks                            and benefits of the procedure and the sedation                            options and risks were discussed with the patient.                            All questions were answered, and informed consent                            was obtained. Prior Anticoagulants: The patient has  taken no previous anticoagulant or antiplatelet                            agents. ASA Grade Assessment: II - A patient with                            mild systemic disease. After reviewing the risks                            and benefits, the patient was deemed in                            satisfactory condition to undergo the procedure.                            After  obtaining informed consent, the endoscope was                            passed under direct vision. Throughout the                            procedure, the patient's blood pressure, pulse, and                            oxygen saturations were monitored continuously. The                            [id number] ([id number] scope was introduced through the                            mouth, and advanced to the second part of duodenum.                            The upper GI endoscopy was somewhat difficult due                            to the patient's agitation. Successful completion                            of the procedure was aided by increasing the dose                            of sedation medication. The patient tolerated the                            procedure fairly well. Scope In: 1:53:39 PM Scope Out: 2:03:07 PM Total Procedure Duration: 0 hours 9 minutes 28 seconds  Findings:      No gross lesions were noted in the entire esophagus. A guidewire was       placed and the scope was withdrawn. Dilation was performed with a Savary       dilator with mild resistance at 14 mm, 15 mm and 16 mm DUE TO POSSIBLE       PROXIMAL ESOPHAGEAL WEB.  LARGE HIATAL HERNIA      Multiple small sessile polyps were found in the gastric fundus and in       the gastric body. Biopsies were taken with a cold forceps for histology.      The examined duodenum was normal.      Patchy mild inflammation characterized by congestion (edema) and       erythema was found in the gastric body. Biopsies were taken with a cold       forceps for Helicobacter pylori testing. Impression:               - POST-PRANDIAL VOMITING MOST LIKELY DUE TO large                            hiatal hernia/GERD AND EXACERBATED BY LIFESTYLE                            CHOICES                           - Multiple gastric polyps. Moderate Sedation:      Moderate (conscious) sedation was administered by the endoscopy nurse       and  supervised by the endoscopist. The following parameters were       monitored: oxygen saturation, heart rate, blood pressure, and response       to care. Total physician intraservice time was 29 minutes. Recommendation:           - Soft diet.                           - Continue present medications.                           - Await pathology results. COMPLETE UGI.                           - Return to my office in 4 months.                           - Patient has a contact number available for                            emergencies. The signs and symptoms of potential                            delayed complications were discussed with the                            patient. Return to normal activities tomorrow.                            Written discharge instructions were provided to the                            patient. Procedure Code(s):        --- Professional ---  [phone], Esophagogastroduodenoscopy, flexible,                            transoral; with insertion of guide wire followed by                            passage of dilator(s) through esophagus over guide                            wire                           43239, Esophagogastroduodenoscopy, flexible,                            transoral; with biopsy, single or multiple                           99152, Moderate sedation services provided by the                            same physician or other qualified health care                            professional performing the diagnostic or                            therapeutic service that the sedation supports,                            requiring the presence of an independent trained                            observer to assist in the monitoring of the                            patient's level of consciousness and physiological                            status; initial 15 minutes of intraservice time,                            patient age 66  years or older                           [phone], Moderate sedation services; each additional                            15 minutes intraservice time Diagnosis Code(s):        --- Professional ---                           K44.9, Diaphragmatic hernia without obstruction or                            gangrene  K31.7, Polyp of stomach and duodenum                           R13.10, Dysphagia, unspecified                           R11.2, Nausea with vomiting, unspecified CPT copyright 2016 American Medical Association. All rights reserved. The codes documented in this report are preliminary and upon coder review may  be revised to meet current compliance requirements. Jonette Eva, MD Jonette Eva, MD 01/18/2016 2:29:13 PM This report has been signed electronically. Number of Addenda: 0

## 2016-01-20 ENCOUNTER — Telehealth: Payer: Self-pay | Admitting: Gastroenterology

## 2016-01-20 ENCOUNTER — Encounter: Payer: Self-pay | Admitting: Gastroenterology

## 2016-01-20 NOTE — Telephone Encounter (Signed)
Tried to call. Mail box full and cannot leave a message.

## 2016-01-20 NOTE — Telephone Encounter (Signed)
OV made °

## 2016-01-20 NOTE — Telephone Encounter (Signed)
PATIENT CALLED WANTING SOMETHING FOR HER STOMACH.  SAYS IT HURTS.  [phone]

## 2016-01-20 NOTE — Telephone Encounter (Signed)
Pt returned phone call and said that you could call her back at [phone]

## 2016-01-20 NOTE — Telephone Encounter (Signed)
Tried to call, mailbox full and could not leave a vm.

## 2016-01-20 NOTE — Telephone Encounter (Signed)
Tried to call this number and line was busy.

## 2016-01-20 NOTE — Telephone Encounter (Signed)
[phone]  PLEASE CALL PATIENT, SHE STATES HER STOMACH HURTS AND SHE CANT KEEP FOOD DOWN

## 2016-01-20 NOTE — Telephone Encounter (Signed)
Please call pt. HER stomach Bx shows gastritis.    CONTINUE YOUR WEIGHT LOSS EFFORTS. YOU SHOULD LOSE 50 POUNDS.   EAT 4 TO 6 SMALL MEALS A DAY.  FOLLOW A LOW FAT/SOFT MECHANICAL DIET DIET. MEATS SHOULD BE BAKED, BROILED, OR BOILED.  MEATS SHOULD BE CHOPPED OR GROUND ONLY. AVOID FRIED FOODS. DO NOT EAT CHUNKS OF ANYTHING.  AVOID TRIGGERS FOR REFLUX SEE INFO BELOW.  AKE OMEPRAZOLE 30 MINUTES PRIOR TO YOUR FIRST MEAL DAILY.  COMPLETE THE UGI SERIES TO ASSESS YOUR HIATAL HERNIA.  FOLLOW UP IN 4 MOS E30 GERD/DYSPHAGIA.

## 2016-01-21 ENCOUNTER — Encounter (HOSPITAL_COMMUNITY): Payer: Self-pay | Admitting: Gastroenterology

## 2016-01-21 MED ORDER — OMEPRAZOLE 20 MG PO CPDR: DELAYED_RELEASE_CAPSULE | ORAL | 11 refills | Status: DC

## 2016-01-21 NOTE — Telephone Encounter (Signed)
Tried to call and mail box is full and I could not leave a message.

## 2016-01-21 NOTE — Telephone Encounter (Signed)
PT is aware.

## 2016-01-21 NOTE — Addendum Note (Signed)
Addended by: West Bali on: 01/21/2016 10:34 AM   Modules accepted: Orders

## 2016-01-21 NOTE — Telephone Encounter (Signed)
Pt called and is aware of results. She is also aware of the UGI the time etc and instructions.  She does not have Prilosec and would like Rx sent to [facility] pharmacy.  Pt was complaining of still having problems since the EGD and I went over all of the instructions and told her to take the Prilosec when she gets it and to follow the instructions of 4-6 small meals daily and soft mechanical diet.  She will do so and let us know if she has any more problems.   Sending to Refill box to send the prescription for the Prilosec.

## 2016-01-21 NOTE — Telephone Encounter (Signed)
PLEASE CALL PT. OMEPRAZOLE RX SENT.

## 2016-01-25 ENCOUNTER — Ambulatory Visit (HOSPITAL_COMMUNITY): Payer: Medicare Other

## 2016-01-28 ENCOUNTER — Ambulatory Visit (HOSPITAL_COMMUNITY): Payer: Medicare Other

## 2016-02-04 ENCOUNTER — Ambulatory Visit (HOSPITAL_COMMUNITY)
Admission: RE | Admit: 2016-02-04 | Discharge: 2016-02-04 | Disposition: A | Discharge status: Home / Self Care | Payer: Medicare Other | Source: Ambulatory Visit | Attending: Gastroenterology | Admitting: Gastroenterology

## 2016-02-04 DIAGNOSIS — K219 Gastro-esophageal reflux disease without esophagitis: Secondary | ICD-10-CM | POA: Diagnosis not present

## 2016-02-04 DIAGNOSIS — K449 Diaphragmatic hernia without obstruction or gangrene: Secondary | ICD-10-CM | POA: Diagnosis not present

## 2016-02-04 DIAGNOSIS — R110 Nausea: Secondary | ICD-10-CM

## 2016-02-04 DIAGNOSIS — R112 Nausea with vomiting, unspecified: Secondary | ICD-10-CM | POA: Diagnosis not present

## 2016-02-04 DIAGNOSIS — K224 Dyskinesia of esophagus: Secondary | ICD-10-CM | POA: Insufficient documentation

## 2016-02-04 DIAGNOSIS — R1110 Vomiting, unspecified: Secondary | ICD-10-CM

## 2016-02-07 ENCOUNTER — Telehealth: Payer: Self-pay

## 2016-02-07 NOTE — Telephone Encounter (Signed)
Pt called for her results of UGI and said she has a hernia and thinks that she might need surgery. She is aware that Dr. Evelina Dun if off today and will review it and let her know her recommendations. She has 7-10 business days to review results, pt is aware. Her home number is not working and she can be reached every morning at [phone].

## 2016-02-08 ENCOUNTER — Telehealth: Payer: Self-pay

## 2016-02-08 NOTE — Telephone Encounter (Signed)
Pt called office and said her stomach hurts when she eats. Said she hurts "where the hernia is". C/o mid lower abd pain. She has vomited. She has been taking Dexilant but it doesn't help. When asked pt if she has a fever, she said a little. Asked pt what her temp was and she said she feels hot, gets a headache and has to lay down. Also reports diarrhea.

## 2016-02-09 ENCOUNTER — Telehealth: Payer: Self-pay | Admitting: Gastroenterology

## 2016-02-09 NOTE — Telephone Encounter (Signed)
Pt called back at 0912 asking if the nurse got her message. DS took the call and spoke with patient

## 2016-02-09 NOTE — Telephone Encounter (Signed)
PLEASE CALL PT. If she is in pain she SHOULD GO TO THE NEAREST ED. HER HIATAL HERNIA SHOULD NOT CAUSE 10/10 PAIN.

## 2016-02-09 NOTE — Telephone Encounter (Signed)
I spoke to pt. She said her stomach is hurting all of the time now, when she sits, when she stands or does anything. She said it was the upper abdomen. She is not having any n/v. No diarrhea. Last Bm on 02/04/2016 and it was a good Bm, but nothing since. She said her pain is a 10 now. And I told her if it is a 10 she needs to go to the ED. She said "they will not do anything for me", and I told her if her pain is that bad she should go. She never said for sure if she would, said she thinks she just needs to have surgery for the hernia.

## 2016-02-09 NOTE — Telephone Encounter (Signed)
I called and told pt and asked her if she was going and she said NO. I told her IF her pain is that bad she should go on .  She never said that she would.

## 2016-02-09 NOTE — Telephone Encounter (Signed)
Patient called asking if her xray results were back and she is still in pain and doesn't know what to do. Please call [phone]

## 2016-02-09 NOTE — Telephone Encounter (Signed)
REVIEWED-NO ADDITIONAL RECOMMENDATIONS. 

## 2016-02-09 NOTE — Telephone Encounter (Signed)
See other telephone note from today. I was off yesterday when patient called in.

## 2016-02-09 NOTE — Telephone Encounter (Signed)
Noted  

## 2016-02-10 MED ORDER — LIDOCAINE VISCOUS 2 % MT SOLN: OROMUCOSAL | 1 refills | Status: DC

## 2016-02-10 NOTE — Telephone Encounter (Signed)
PLEASE CALL PT. HER UGI SHOWS REFLUX. SHE HAS A LARGE HIATAL HERNIA BUT NO OBSTRUCTION. IF SHE WANTS TO FEEL BETTER SHE NEEDS EAT 4-6 SMALL MEALS A DAY, AVOID STUFF THAT TRIGGERS REFLUX, LOSE 50 LBS, AND TAKE HER OMEPRAZOLE 40 MG 30 MINS PRIOR TO BREAKFAST AND PUT THE HEAD OF HER BED ON 6 IN BLOCKS. WHEN SHE IS SLEEPING HER HEAD SHOULD BE ABOVE HER HEART.    I REVIEWED THE CT SHE HAD A MOREHEAD YESTERDAY SHE HAD NO ACUTE PATHOLOGY IN HER ABDOMEN. SHE CAN USE VISCOUS LIDOCAINE 2 TSP IF NEEDED TO CONTROL HER ABDOMINAL PAIN. I CAN ALSO REFER HER TO A SURGEON IN [place] TO DISCUSS REPAIRING HER HIATAL HERNIA BUT I DOUBT THEY WILL FIX IT UNTIL HER BMI IS LESS THAN 40.  SHE SHOULD LET ME KNOW IF SHE WANTS THE REFERRAL.

## 2016-02-10 NOTE — Telephone Encounter (Signed)
Pt is aware of the results and plan and she will think about the referral and let us know.

## 2016-02-29 ENCOUNTER — Telehealth: Payer: Self-pay | Admitting: Gastroenterology

## 2016-02-29 NOTE — Telephone Encounter (Signed)
[phone] PLEASE CALL PATIENT, SHE STATES SHE CAN NOT KEEP FOOD DOWN AND HER STOMACH HURTS SO BAD AT NIGHT THAT SHE IS UNABLE TO SLEEP

## 2016-02-29 NOTE — Telephone Encounter (Signed)
I spoke to the pt and she said she is having upper abdominal pain when she eats and very bad at night. She had forgot about the Vicious Lidocaine, and she will get that and she is aware that it is before meals and at bedtime as needed. She will call if that does not help.

## 2016-03-06 NOTE — Telephone Encounter (Signed)
REVIEWED. AGREE. NO ADDITIONAL RECOMMENDATIONS. 

## 2016-04-19 ENCOUNTER — Encounter: Payer: Self-pay | Admitting: Gastroenterology

## 2016-05-16 ENCOUNTER — Other Ambulatory Visit: Payer: Self-pay

## 2016-05-16 ENCOUNTER — Ambulatory Visit (INDEPENDENT_AMBULATORY_CARE_PROVIDER_SITE_OTHER): Payer: Medicare Other | Admitting: Gastroenterology

## 2016-05-16 ENCOUNTER — Encounter: Payer: Self-pay | Admitting: Gastroenterology

## 2016-05-16 VITALS — BP 167/95 | HR 68 | Temp 98.0°F | Ht 62.0 in | Wt 251.4 lb

## 2016-05-16 DIAGNOSIS — R112 Nausea with vomiting, unspecified: Secondary | ICD-10-CM | POA: Diagnosis not present

## 2016-05-16 DIAGNOSIS — K449 Diaphragmatic hernia without obstruction or gangrene: Secondary | ICD-10-CM

## 2016-05-16 DIAGNOSIS — R1013 Epigastric pain: Secondary | ICD-10-CM

## 2016-05-16 DIAGNOSIS — G8929 Other chronic pain: Secondary | ICD-10-CM | POA: Diagnosis not present

## 2016-05-16 NOTE — Patient Instructions (Signed)
Pt has an appointment with Dr.Powell at [facility] on 06/05/16 @ 10:30 am

## 2016-05-16 NOTE — Assessment & Plan Note (Signed)
51 y/o female with known large hiatal hernia, ?paraesophageal component on recent CT 01/2016 who presents with daily postprandial vomiting, epigastric pain. Suspect symptoms due to large hiatal hernia. Discussed need for weight loss. Will start referral process for possible repair of hiatal hernia at [facility]. Patient advised that she may be required to have weight loss prior to surgery. She voiced understanding.   We will also get ruq u/s to check out gallbladder. I doubt biliary etiology of symptoms but remains in differential.

## 2016-05-16 NOTE — Progress Notes (Signed)
Primary Care Physician: Ignatius Specking, MD  Primary Gastroenterologist:  Jonette Eva, MD   Chief Complaint  Patient presents with  . Gastroesophageal Reflux  . Abdominal Pain    HPI: Tracey Mccarthy is a 51 y.o. female here for follow up of postprandial vomiting/regurgitation, upper abdominal pain, GERD. EGD 12/2015 with large hiatal hernia. Esophageal dilated due to h/o dysphagia but no obvious stricture. Subsequent UGI series showed reflux, large hiatal hernia but no obstruction. She had diffuse impairment of esophageal motility with incomplete clearance of barium by primary peristaltic waves. There was shouldering/indentation at the GEJ suggesting prior fundoplication and patient denies. No surgical clips in that area either. She was advised to eat multiple SMALL meals daily, continue omeprazole daily, elevate head of her bed. CT Abd in 01/2016 at [facility] showed moderate sized hiatal hernia with ?paraesophageal componenet.   Every time eats, food regurgitates. Happens multiple times per day. Has not had any significant weight loss. Takes omeprazole daily. TUMS at night. No dysphagia. Epigastric pain worse before and after meals. No better on PPI and viscous lidocaine. Daily abd pain. Gallbladder remains in situ. BMs regular. No melena, brbpr.      Current Outpatient Prescriptions  Medication Sig Dispense Refill  . carbamazepine (TEGRETOL XR) 400 MG 12 hr tablet Take 400 mg by mouth daily.    . carbamazepine (TEGRETOL) 200 MG tablet Take 400 mg by mouth at bedtime.     . chlorthalidone (HYGROTON) 25 MG tablet Take 25 mg by mouth daily.    . citalopram (CELEXA) 10 MG tablet Take 10 mg by mouth at bedtime.     Marland Kitchen doxepin (SINEQUAN) 10 MG capsule     . fluticasone (FLONASE) 50 MCG/ACT nasal spray Place 2 sprays into the nose daily.    Marland Kitchen lidocaine (XYLOCAINE) 2 % solution 2 TSP  PO 30 MINS PRIOR TO MEALS AND AT BEDTIME PRN FOR ABDOMINAL OR CHEST PAIN. MAY REPEAT DOSE EVERY 4 HOURS. NO MORE  THAN 8 DOSES A DAY. 300 mL 1  . omeprazole (PRILOSEC) 20 MG capsule 1 po 30 mind prior to first meal 30 capsule 11  . ondansetron (ZOFRAN) 4 MG tablet Take 4 mg by mouth as needed.    Marland Kitchen tiZANidine (ZANAFLEX) 4 MG capsule Take 4 mg by mouth 2 (two) times daily as needed for muscle spasms.      No current facility-administered medications for this visit.     Allergies as of 05/16/2016  . (No Known Allergies)   Past Medical History:  Diagnosis Date  . Degenerative arthritis   . GERD (gastroesophageal reflux disease)   . Headache   . Hypertension   . Obese   . Optic neuritis   . Rheumatoid arthritis [facility])    Past Surgical History:  Procedure Laterality Date  . ABDOMINAL HYSTERECTOMY    . arm surgery Right    knot removed  . BIOPSY  01/18/2016   Procedure: BIOPSY;  Surgeon: West Bali, MD;  Location: [facility];  Service: Endoscopy;;  gastric  . COLONOSCOPY N/A 05/18/2014   Dr. Darrick Penna: Moderate sized external hemorrhoids, normal terminal ileum and colon. Next colonoscopy 2026  . ESOPHAGOGASTRODUODENOSCOPY N/A 01/18/2016   Procedure: ESOPHAGOGASTRODUODENOSCOPY (EGD);  Surgeon: West Bali, MD;  Location: [facility];  Service: Endoscopy;  Laterality: N/A;  145  . KNEE SURGERY Left   . SAVORY DILATION N/A 01/18/2016   Procedure: SAVORY DILATION;  Surgeon: West Bali, MD;  Location:  [facility];  Service: Endoscopy;  Laterality: N/A;   Family History  Problem Relation Age of Onset  . Pneumonia Father   . Diabetes Father   . Hypertension Father   . Heart attack Father   . Colon cancer Father     greater than 60  . Colon cancer Paternal Aunt     greater than 5   Social History   Social History  . Marital status: Married    Spouse name: N/A  . Number of children: 2  . Years of education: college   Occupational History  .  Unemployed   Social History Main Topics  . Smoking status: Never Smoker  . Smokeless tobacco: Never Used  . Alcohol use No  .  Drug use: No  . Sexual activity: Yes    Birth control/ protection: Surgical   Other Topics Concern  . None   Social History Narrative  . None    ROS:  General: Negative for anorexia, weight loss, fever, chills, fatigue, weakness. ENT: Negative for hoarseness, difficulty swallowing , nasal congestion. CV: Negative for chest pain, angina, palpitations, dyspnea on exertion, peripheral edema.  Respiratory: Negative for dyspnea at rest, dyspnea on exertion, cough, sputum, wheezing.  GI: See history of present illness. GU:  Negative for dysuria, hematuria, urinary incontinence, urinary frequency, nocturnal urination.  Endo: Negative for unusual weight change.    Physical Examination:   BP (!) 167/95   Pulse 68   Temp 98 F (36.7 C) (Oral)   Ht 5\' 2"  (1.575 m)   Wt 251 lb 6.4 oz (114 kg)   BMI 45.98 kg/m   General: Well-nourished, well-developed in no acute distress.  Eyes: No icterus. Mouth: Oropharyngeal mucosa moist and pink , no lesions erythema or exudate. Lungs: Clear to auscultation bilaterally.  Heart: Regular rate and rhythm, no murmurs rubs or gallops.  Abdomen: Bowel sounds are normal, nontender, nondistended, no hepatosplenomegaly or masses, no abdominal bruits or hernia , no rebound or guarding. Exam limited by body habitus.    Extremities: No lower extremity edema. No clubbing or deformities. Neuro: Alert and oriented x 4   Skin: Warm and dry, no jaundice.   Psych: Alert and cooperative, normal mood and affect.   Imaging Studies: No results found.

## 2016-05-16 NOTE — Patient Instructions (Signed)
1. Referral to surgeon at [facility] for hiatal hernia repair.  2. Abdominal ultrasound at [facility] as scheduled.

## 2016-05-17 NOTE — Progress Notes (Signed)
cc'ed to pcp °

## 2016-05-18 ENCOUNTER — Ambulatory Visit: Payer: Medicare Other | Admitting: Gastroenterology

## 2016-05-22 ENCOUNTER — Ambulatory Visit (HOSPITAL_COMMUNITY)
Admission: RE | Admit: 2016-05-22 | Discharge: 2016-05-22 | Disposition: A | Discharge status: Home / Self Care | Payer: Medicare Other | Source: Ambulatory Visit | Attending: Gastroenterology | Admitting: Gastroenterology

## 2016-05-22 DIAGNOSIS — R932 Abnormal findings on diagnostic imaging of liver and biliary tract: Secondary | ICD-10-CM | POA: Diagnosis not present

## 2016-05-22 DIAGNOSIS — K449 Diaphragmatic hernia without obstruction or gangrene: Secondary | ICD-10-CM

## 2016-05-22 DIAGNOSIS — R112 Nausea with vomiting, unspecified: Secondary | ICD-10-CM | POA: Diagnosis not present

## 2016-05-22 DIAGNOSIS — G8929 Other chronic pain: Secondary | ICD-10-CM

## 2016-05-22 DIAGNOSIS — R1013 Epigastric pain: Secondary | ICD-10-CM | POA: Diagnosis not present

## 2016-05-31 NOTE — Progress Notes (Signed)
Some fatty liver, lfts normal.  Cannot exclude very tiny gallstones but no evidence of acute cholecystitis.   I would have her proceed with surgical referral at [facility] as planned, seeing next week.

## 2016-05-31 NOTE — Progress Notes (Signed)
LMOM to call.

## 2016-05-31 NOTE — Progress Notes (Signed)
Given fatty liver info:  Instructions for fatty liver: Recommend 1-2# weight loss per week until ideal body weight through exercise & diet. Low fat/cholesterol diet.   Avoid sweets, sodas, fruit juices, sweetened beverages like tea, etc. Gradually increase exercise from 15 min daily up to 1 hr per day 5 days/week. Limit alcohol use.

## 2016-06-05 NOTE — Progress Notes (Signed)
Mailed letter to call also.  

## 2016-06-05 NOTE — Progress Notes (Signed)
LMOM to call.

## 2016-06-06 NOTE — Progress Notes (Signed)
LMOM for a return call.  

## 2016-06-06 NOTE — Progress Notes (Signed)
PT is aware.

## 2016-08-02 ENCOUNTER — Telehealth: Payer: Self-pay | Admitting: Gastroenterology

## 2016-08-02 NOTE — Telephone Encounter (Signed)
We received labs from 04/2016.   H/H 11.4/36.7, MCV 67.7  EGD 12/2015 TCS 05/2014  Recommend CBC, iron/tibc, ferritin, ifobt.

## 2016-08-03 ENCOUNTER — Other Ambulatory Visit: Payer: Self-pay

## 2016-08-03 DIAGNOSIS — R7989 Other specified abnormal findings of blood chemistry: Secondary | ICD-10-CM

## 2016-08-03 NOTE — Telephone Encounter (Signed)
Tried to call. Mail box full and could not leave a message. Lab orders have been entered and released. Ifobt at front desk to pick up.  Mailing a letter for pt to call.

## 2016-08-10 NOTE — Telephone Encounter (Signed)
PT is aware and will come by to pick up the iFOBT.

## 2016-08-14 LAB — CBC WITH DIFFERENTIAL/PLATELET
Basophils Absolute: 0 cells/uL (ref 0–200)
Basophils Relative: 0 %
Eosinophils Absolute: 292 cells/uL (ref 15–500)
Eosinophils Relative: 4 %
HCT: 29.8 % — ABNORMAL LOW (ref 35.0–45.0)
Hemoglobin: 9.5 g/dL — ABNORMAL LOW (ref 11.7–15.5)
Lymphocytes Relative: 29 %
Lymphs Abs: 2117 cells/uL (ref 850–3900)
MCH: 20.6 pg — ABNORMAL LOW (ref 27.0–33.0)
MCHC: 31.9 g/dL — ABNORMAL LOW (ref 32.0–36.0)
MCV: 64.5 fL — ABNORMAL LOW (ref 80.0–100.0)
Monocytes Absolute: 584 cells/uL (ref 200–950)
Monocytes Relative: 8 %
Neutro Abs: 4307 cells/uL (ref 1500–7800)
Neutrophils Relative %: 59 %
Platelets: 530 10*3/uL — ABNORMAL HIGH (ref 140–400)
RBC: 4.62 MIL/uL (ref 3.80–5.10)
RDW: 17.6 % — ABNORMAL HIGH (ref 11.0–15.0)
WBC: 7.3 10*3/uL (ref 3.8–10.8)

## 2016-08-14 LAB — IRON AND TIBC
%SAT: 9 % — ABNORMAL LOW (ref 11–50)
Iron: 33 ug/dL — ABNORMAL LOW (ref 45–160)
TIBC: 377 ug/dL (ref 250–450)
UIBC: 344 ug/dL

## 2016-08-15 LAB — FERRITIN: Ferritin: 10 ng/mL (ref 10–232)

## 2016-08-16 ENCOUNTER — Telehealth: Payer: Self-pay

## 2016-08-16 ENCOUNTER — Other Ambulatory Visit: Payer: Self-pay

## 2016-08-16 ENCOUNTER — Ambulatory Visit (INDEPENDENT_AMBULATORY_CARE_PROVIDER_SITE_OTHER): Payer: Medicare Other

## 2016-08-16 DIAGNOSIS — D582 Other hemoglobinopathies: Secondary | ICD-10-CM

## 2016-08-16 LAB — IFOBT (OCCULT BLOOD): IFOBT: NEGATIVE

## 2016-08-16 NOTE — Progress Notes (Signed)
Microcytic anemia. Hgb down from 11.4 in 04/2016.   Please request she turn in ifobt ASAP.  She had recent EGD/ED at [facility] 06/2016.  Has she seen any blood in her stools or black stools? Recheck CBC in 4 weeks.

## 2016-08-16 NOTE — Telephone Encounter (Signed)
noted 

## 2016-08-16 NOTE — Telephone Encounter (Signed)
Pt said she has no personal transportation, but [facility] said that they could bring her up here to drop off iFOBT if she had a letter faxed to them stating that she should bring it today.  Tana Coast, PA has requested it ASAP since her hemoglobin has dropped some.  Ginger is faxing over a letter to [facility] and pt is aware she only has to bring it in, she does not have to stay.

## 2016-08-16 NOTE — Progress Notes (Signed)
Pt is aware of results. She will try to get the stool test by soon as she can. She has transportation issues. She has not seen any blood in stool or any black stools. She is aware we will mail her orders to recheck her labs in 4 weeks.

## 2016-08-22 ENCOUNTER — Telehealth: Payer: Self-pay

## 2016-08-22 NOTE — Telephone Encounter (Signed)
Pt is wanting to know about her results. Please advise

## 2016-08-30 ENCOUNTER — Other Ambulatory Visit: Payer: Self-pay

## 2016-08-30 DIAGNOSIS — D582 Other hemoglobinopathies: Secondary | ICD-10-CM

## 2016-09-03 NOTE — Progress Notes (Signed)
Recent labs indicated worsening microcytic anemia, low iron/sat, low normal ferritin 10. Last EGD 06/2016 at [facility] with no findings to explain anemia. TCS 2016 with Dr. Darrick Penna, hemorrhoids and normal TI.   Stool heme negative.   Consider TCS vs capsule study. Patient with prior Nissen and recent esophageal dilation of stricture, 3 cm hiatal hernia noted. WILL ASK FOR SLF ADVISE.

## 2016-09-03 NOTE — Telephone Encounter (Signed)
Please apologize to patient for delayed response but I have been out of the office for two weeks. See result note.

## 2016-09-04 ENCOUNTER — Telehealth: Payer: Self-pay

## 2016-09-04 ENCOUNTER — Other Ambulatory Visit: Payer: Self-pay

## 2016-09-04 DIAGNOSIS — D509 Iron deficiency anemia, unspecified: Secondary | ICD-10-CM

## 2016-09-04 NOTE — Telephone Encounter (Signed)
Phenergan 12.5mg  IV 45 minutes before procedure to augment conscious sedation.

## 2016-09-04 NOTE — Progress Notes (Signed)
PT is aware of results. Also aware that we are waiting on Dr. Darrick Penna regarding next step.

## 2016-09-04 NOTE — Progress Notes (Signed)
PT is aware. Forwarding to [facility] to schedule.

## 2016-09-04 NOTE — Telephone Encounter (Signed)
Gastroenterology Pre-Procedure Review  Request Date: Requesting Physician: Dr. Darrick Penna  EGD with Givens Capsule Placement per [facility]  PATIENT REVIEW QUESTIONS: The patient responded to the following health history questions as indicated:    1. Diabetes Melitis: NO 2. Joint replacements in the past 12 months: NO 3. Major health problems in the past 3 months: NO 4. Has an artificial valve or MVP: NO 5. Has a defibrillator: NO 6. Has been advised in past to take antibiotics in advance of a procedure like teeth cleaning: NO 7. Family history of colon cancer: NO  8. Alcohol Use: NO 9. History of sleep apnea: NO  10. History of coronary artery or other vascular stents placed within the last 12 months: NO 11. History of any prior anesthesia complications: NO    MEDICATIONS & ALLERGIES:    Patient reports the following regarding taking any blood thinners:   Plavix? NO Aspirin? NO Coumadin? NO Brilinta? NO Xarelto? NO Eliquis? NO Pradaxa? NO Savaysa? NO Effient? NO  Patient confirms/reports the following medications:  Current Outpatient Prescriptions  Medication Sig Dispense Refill  . carbamazepine (TEGRETOL) 200 MG tablet Take 400 mg by mouth at bedtime.     . chlorthalidone (HYGROTON) 25 MG tablet Take 25 mg by mouth daily.    . citalopram (CELEXA) 10 MG tablet Take 10 mg by mouth at bedtime.     Marland Kitchen doxepin (SINEQUAN) 10 MG capsule Take 10 mg by mouth daily.     . fluticasone (FLONASE) 50 MCG/ACT nasal spray Place 2 sprays into the nose daily.    Marland Kitchen lidocaine (XYLOCAINE) 2 % solution 2 TSP  PO 30 MINS PRIOR TO MEALS AND AT BEDTIME PRN FOR ABDOMINAL OR CHEST PAIN. MAY REPEAT DOSE EVERY 4 HOURS. NO MORE THAN 8 DOSES A DAY. 300 mL 1  . omeprazole (PRILOSEC) 20 MG capsule 1 po 30 mind prior to first meal 30 capsule 11  . ondansetron (ZOFRAN) 4 MG tablet Take 4 mg by mouth as needed.    Marland Kitchen tiZANidine (ZANAFLEX) 4 MG capsule Take 4 mg by mouth 2 (two) times daily as needed for muscle spasms.      . carbamazepine (TEGRETOL XR) 400 MG 12 hr tablet Take 400 mg by mouth daily.     No current facility-administered medications for this visit.     Patient confirms/reports the following allergies:  No Known Allergies  No orders of the defined types were placed in this encounter.   AUTHORIZATION INFORMATION Primary Insurance: [facility] MEDICARE,  ID #: [id number],  Group #: [id number] Pre-Cert / Berkley Harvey required:  Pre-Cert / Auth #:   SCHEDULE INFORMATION: Procedure has been scheduled as follows:  Date: , Time:   Location:   This Gastroenterology Pre-Precedure Review Form is being routed to the following provider(s): Jonette Eva, MD

## 2016-09-04 NOTE — Telephone Encounter (Signed)
Called pt. EGD/Givens Capsule placement with [facility] scheduled for 10/23/16 at 8:30am. She will be riding [facility], informed her she would need someone to ride [place] with her-she said she would take care of it. Instructions mailed to pt. Orders entered.

## 2016-09-05 NOTE — Telephone Encounter (Signed)
New instructions mailed to pt (EGD instructions were disregarded). PA info for EGD/Givens Capsule submitted via [facility] website. No PA needed. Decision ID# [id number].

## 2016-10-03 ENCOUNTER — Telehealth: Payer: Self-pay

## 2016-10-03 ENCOUNTER — Other Ambulatory Visit: Payer: Self-pay

## 2016-10-03 DIAGNOSIS — D509 Iron deficiency anemia, unspecified: Secondary | ICD-10-CM

## 2016-10-03 NOTE — Telephone Encounter (Signed)
Bonnie at [employer] called. She said that pt is "suspended" for transportation for 30 days d/t she missed 3 appts, this is Medicaid's policy. She will not be eligible for transportation until October. Called and informed pt. Informed her to call office to schedule OV to update her information when she can get transportation. Called and informed Endo scheduler to cancel procedure.   Pt then called office to see if she could still have procedure if she found a ride. GF informed her to let us know as soon as possible.

## 2016-10-03 NOTE — Telephone Encounter (Signed)
Pt called office. She wanted to reschedule upcoming EGD/Givens that is scheduled for 10/23/16. She said [facility] will not pick her up because she has missed 3 appts that they were to pick her up for. Advised pt that there aren't any available procedure times for this month. (her last OV was 05/16/16, she was triaged for EGD/Givens 09/04/16). Informed her I would send letter to [employer] for her to see if it would help her get transportation. She is ok with me faxing letter to [employer] for her.  Letter faxed to [employer].

## 2016-10-03 NOTE — Telephone Encounter (Signed)
Pt called office. She found transportation so she can have EGD/Givens placement 10/23/16 at 8:30am. She doesn't have her instructions. Re-mailed Givens instructions. Reentered orders for procedure.

## 2016-10-23 ENCOUNTER — Encounter (HOSPITAL_COMMUNITY): Payer: Self-pay | Admitting: *Deleted

## 2016-10-23 ENCOUNTER — Encounter (HOSPITAL_COMMUNITY): Payer: Self-pay

## 2016-10-23 ENCOUNTER — Encounter (HOSPITAL_COMMUNITY)
Admission: RE | Disposition: A | Discharge status: Home / Self Care | Payer: Self-pay | Source: Ambulatory Visit | Attending: Gastroenterology

## 2016-10-23 ENCOUNTER — Ambulatory Visit (HOSPITAL_COMMUNITY)
Admission: RE | Admit: 2016-10-23 | Discharge: 2016-10-23 | Disposition: A | Discharge status: Home / Self Care | Payer: Medicare Other | Source: Ambulatory Visit | Attending: Gastroenterology | Admitting: Gastroenterology

## 2016-10-23 ENCOUNTER — Ambulatory Visit (HOSPITAL_COMMUNITY): Admit: 2016-10-23 | Payer: Medicare Other | Admitting: Gastroenterology

## 2016-10-23 DIAGNOSIS — F419 Anxiety disorder, unspecified: Secondary | ICD-10-CM | POA: Diagnosis not present

## 2016-10-23 DIAGNOSIS — E669 Obesity, unspecified: Secondary | ICD-10-CM | POA: Insufficient documentation

## 2016-10-23 DIAGNOSIS — K449 Diaphragmatic hernia without obstruction or gangrene: Secondary | ICD-10-CM | POA: Insufficient documentation

## 2016-10-23 DIAGNOSIS — Z8249 Family history of ischemic heart disease and other diseases of the circulatory system: Secondary | ICD-10-CM | POA: Insufficient documentation

## 2016-10-23 DIAGNOSIS — D509 Iron deficiency anemia, unspecified: Secondary | ICD-10-CM | POA: Diagnosis not present

## 2016-10-23 DIAGNOSIS — K317 Polyp of stomach and duodenum: Secondary | ICD-10-CM | POA: Diagnosis not present

## 2016-10-23 DIAGNOSIS — I10 Essential (primary) hypertension: Secondary | ICD-10-CM | POA: Insufficient documentation

## 2016-10-23 DIAGNOSIS — Z79899 Other long term (current) drug therapy: Secondary | ICD-10-CM | POA: Diagnosis not present

## 2016-10-23 DIAGNOSIS — Z833 Family history of diabetes mellitus: Secondary | ICD-10-CM | POA: Insufficient documentation

## 2016-10-23 DIAGNOSIS — Z6841 Body Mass Index (BMI) 40.0 and over, adult: Secondary | ICD-10-CM | POA: Diagnosis not present

## 2016-10-23 DIAGNOSIS — Z800 Family history of malignant neoplasm of digestive organs: Secondary | ICD-10-CM | POA: Diagnosis not present

## 2016-10-23 DIAGNOSIS — Z90710 Acquired absence of both cervix and uterus: Secondary | ICD-10-CM | POA: Insufficient documentation

## 2016-10-23 DIAGNOSIS — F329 Major depressive disorder, single episode, unspecified: Secondary | ICD-10-CM | POA: Diagnosis not present

## 2016-10-23 DIAGNOSIS — K219 Gastro-esophageal reflux disease without esophagitis: Secondary | ICD-10-CM | POA: Diagnosis not present

## 2016-10-23 DIAGNOSIS — M069 Rheumatoid arthritis, unspecified: Secondary | ICD-10-CM | POA: Diagnosis not present

## 2016-10-23 HISTORY — PX: ESOPHAGOGASTRODUODENOSCOPY: SHX5428

## 2016-10-23 HISTORY — DX: Depression, unspecified: F32.A

## 2016-10-23 HISTORY — DX: Anxiety disorder, unspecified: F41.9

## 2016-10-23 HISTORY — PX: GIVENS CAPSULE STUDY: SHX5432

## 2016-10-23 HISTORY — DX: Major depressive disorder, single episode, unspecified: F32.9

## 2016-10-23 LAB — CBC
HCT: 29.4 % — ABNORMAL LOW (ref 36.0–46.0)
Hemoglobin: 8.9 g/dL — ABNORMAL LOW (ref 12.0–15.0)
MCH: 19.7 pg — ABNORMAL LOW (ref 26.0–34.0)
MCHC: 30.3 g/dL (ref 30.0–36.0)
MCV: 65.2 fL — ABNORMAL LOW (ref 78.0–100.0)
Platelets: 337 10*3/uL (ref 150–400)
RBC: 4.51 MIL/uL (ref 3.87–5.11)
RDW: 19.5 % — ABNORMAL HIGH (ref 11.5–15.5)
WBC: 6.3 10*3/uL (ref 4.0–10.5)

## 2016-10-23 LAB — FERRITIN: Ferritin: 8 ng/mL — ABNORMAL LOW (ref 11–307)

## 2016-10-23 SURGERY — EGD (ESOPHAGOGASTRODUODENOSCOPY)
Anesthesia: Moderate Sedation

## 2016-10-23 MED ORDER — MIDAZOLAM HCL 5 MG/5ML IJ SOLN
INTRAMUSCULAR | Status: DC | PRN
  Administered 2016-10-23: 1 mg via INTRAVENOUS
  Administered 2016-10-23 (×2): 2 mg via INTRAVENOUS

## 2016-10-23 MED ORDER — PROMETHAZINE HCL 25 MG/ML IJ SOLN
INTRAMUSCULAR | Status: AC
  Filled 2016-10-23: qty 1

## 2016-10-23 MED ORDER — PROMETHAZINE HCL 25 MG/ML IJ SOLN
12.5000 mg | Freq: Once | INTRAMUSCULAR | Status: AC
  Administered 2016-10-23: 12.5 mg via INTRAVENOUS

## 2016-10-23 MED ORDER — SODIUM CHLORIDE 0.9 % IV SOLN
INTRAVENOUS | Status: DC
  Administered 2016-10-23: 07:00:00 via INTRAVENOUS

## 2016-10-23 MED ORDER — LIDOCAINE VISCOUS 2 % MT SOLN
OROMUCOSAL | Status: DC | PRN
  Administered 2016-10-23: 4 mL via OROMUCOSAL

## 2016-10-23 MED ORDER — SODIUM CHLORIDE 0.9% FLUSH
INTRAVENOUS | Status: AC
  Administered 2016-10-23: 10 mL
  Filled 2016-10-23: qty 10

## 2016-10-23 MED ORDER — MIDAZOLAM HCL 5 MG/5ML IJ SOLN
INTRAMUSCULAR | Status: AC
  Filled 2016-10-23: qty 10

## 2016-10-23 MED ORDER — MEPERIDINE HCL 100 MG/ML IJ SOLN
INTRAMUSCULAR | Status: DC | PRN
  Administered 2016-10-23: 25 mg via INTRAVENOUS
  Administered 2016-10-23: 50 mg via INTRAVENOUS

## 2016-10-23 MED ORDER — LIDOCAINE VISCOUS 2 % MT SOLN
OROMUCOSAL | Status: AC
  Filled 2016-10-23: qty 15

## 2016-10-23 MED ORDER — SIMETHICONE 40 MG/0.6ML PO SUSP
ORAL | Status: DC | PRN
  Administered 2016-10-23: 09:00:00

## 2016-10-23 MED ORDER — MEPERIDINE HCL 100 MG/ML IJ SOLN
INTRAMUSCULAR | Status: AC
  Filled 2016-10-23: qty 2

## 2016-10-23 NOTE — H&P (Signed)
Primary Care Physician:  Ignatius Specking, MD Primary Gastroenterologist:  Dr. Darrick Penna  Pre-Procedure History & Physical: HPI:  Tracey Mccarthy is a 51 y.o. female here for IRON DEFICIENCY ANEMIA.  Past Medical History:  Diagnosis Date  . Anxiety   . Degenerative arthritis   . Depression   . GERD (gastroesophageal reflux disease)   . Headache   . Hypertension   . Obese   . Optic neuritis   . Rheumatoid arthritis [facility])     Past Surgical History:  Procedure Laterality Date  . ABDOMINAL HYSTERECTOMY    . arm surgery Right    knot removed  . BIOPSY  01/18/2016   Procedure: BIOPSY;  Surgeon: West Bali, MD;  Location: [facility];  Service: Endoscopy;;  gastric  . COLONOSCOPY N/A 05/18/2014   Dr. Darrick Penna: Moderate sized external hemorrhoids, normal terminal ileum and colon. Next colonoscopy 2026  . ESOPHAGOGASTRODUODENOSCOPY N/A 01/18/2016   Procedure: ESOPHAGOGASTRODUODENOSCOPY (EGD);  Surgeon: West Bali, MD;  Location: [facility];  Service: Endoscopy;  Laterality: N/A;  145  . KNEE SURGERY Left   . SAVORY DILATION N/A 01/18/2016   Procedure: SAVORY DILATION;  Surgeon: West Bali, MD;  Location: [facility];  Service: Endoscopy;  Laterality: N/A;    Prior to Admission medications   Medication Sig Start Date End Date Taking? Authorizing Provider  carbamazepine (TEGRETOL XR) 400 MG 12 hr tablet Take 400 mg by mouth at bedtime.  01/01/16  Yes [provider]  carboxymethylcellulose (REFRESH PLUS) 0.5 % SOLN Place 1 drop into both eyes 3 (three) times daily as needed (for dry eyes.).   Yes [provider]  citalopram (CELEXA) 10 MG tablet Take 10 mg by mouth at bedtime.    Yes [provider]  doxepin (SINEQUAN) 10 MG capsule Take 10 mg by mouth at bedtime as needed (sleep).  01/01/16  Yes [provider]  HYDROcodone-acetaminophen (NORCO/VICODIN) 5-325 MG tablet Take 1 tablet by mouth 2 (two) times daily as needed for moderate pain.    Yes [provider]  metoprolol tartrate (LOPRESSOR) 50 MG tablet Take 50 mg by mouth 2 (two) times daily. 08/30/16  Yes [provider]  nitrofurantoin (MACRODANTIN) 100 MG capsule Take 100 mg by mouth 2 (two) times daily.   Yes [provider]  omeprazole (PRILOSEC) 20 MG capsule 1 po 30 mind prior to first meal Patient taking differently: Take 20 mg by mouth daily as needed (acid reflux). 1 po 30 mind prior to first meal 01/21/16  Yes Ananda Caya, Darleene Cleaver, MD  promethazine (PHENERGAN) 25 MG tablet Take 25 mg by mouth every 6 (six) hours as needed for nausea or vomiting.   Yes [provider]  tiZANidine (ZANAFLEX) 4 MG capsule Take 4 mg by mouth 3 (three) times daily as needed for muscle spasms.    Yes [provider]  traMADol (ULTRAM) 50 MG tablet Take 50 mg by mouth every 8 (eight) hours as needed. For pain. 08/01/16  Yes [provider]    Allergies as of 10/03/2016  . (No Known Allergies)    Family History  Problem Relation Age of Onset  . Pneumonia Father   . Diabetes Father   . Hypertension Father   . Heart attack Father   . Colon cancer Father        greater than 60  . Colon cancer Paternal Aunt        greater than 37    Social  History   Social History  . Marital status: Married    Spouse name: N/A  . Number of children: 2  . Years of education: college   Occupational History  .  Unemployed   Social History Main Topics  . Smoking status: Never Smoker  . Smokeless tobacco: Never Used  . Alcohol use No  . Drug use: No  . Sexual activity: Yes    Birth control/ protection: Surgical   Other Topics Concern  . Not on file   Social History Narrative  . No narrative on file    Review of Systems: See HPI, otherwise negative ROS   Physical Exam: BP (!) 155/96   Pulse 71   Temp 97.7 F (36.5 C) (Oral)   Resp (!) 21   Ht 5\' 2"  (1.575 m)   Wt 255 lb (115.7 kg)   SpO2 99%   BMI 46.64 kg/m  General:   Alert,   pleasant and cooperative in NAD Head:  Normocephalic and atraumatic. Neck:  Supple; Lungs:  Clear throughout to auscultation.    Heart:  Regular rate and rhythm. Abdomen:  Soft, nontender and nondistended. Normal bowel sounds, without guarding, and without rebound.   Neurologic:  Alert and  oriented x4;  grossly normal neurologically.  Impression/Plan:     Anemia  PLAN:  1. EGD/givens capsule placement TODAY. Needs cbc/ferritin today. DISCUSSED PROCEDURE, BENEFITS, & RISKS: < 1% chance of medication reaction, bleeding, or perforation.

## 2016-10-23 NOTE — Discharge Instructions (Signed)
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 I placed the capsule in your small bowel.   RETURN THE RECORDER WITHIN THE NEXT 24 HRS.  CONTINUE OMEPRAZOLE 30 MINUTES PRIOR TO YOUR FIRST MEAL DAILY.  FOLLOW A LOW FAT DIET. SEE INFO BELOW.  YOUR capsule results will AVAILABLE IN 10-14 DAYS AFTER YOU RETURN THE RECORDER.  FOLLOW UP IN DEC 2018 WITH DR. FIELDS.   UPPER ENDOSCOPY AFTER CARE Read the instructions outlined below and refer to this sheet in the next week. These discharge instructions provide you with general information on caring for yourself after you leave the hospital. While your treatment has been planned according to the most current medical practices available, unavoidable complications occasionally occur. If you have any problems or questions after discharge, call DR. FIELDS, [phone].  ACTIVITY  You may resume your regular activity, but move at a slower pace for the next 24 hours.   Take frequent rest periods for the next 24 hours.   Walking will help get rid of the air and reduce the bloated feeling in your belly (abdomen).   No driving for 24 hours (because of the medicine (anesthesia) used during the test).   You may shower.   Do not sign any important legal documents or operate any machinery for 24 hours (because of the anesthesia used during the test).    NUTRITION  Drink plenty of fluids.   You may resume your normal diet as instructed by your doctor.   Begin with a light meal and progress to your normal diet. Heavy or fried foods are harder to digest and may make you feel sick to your stomach (nauseated).   Avoid alcoholic beverages for 24 hours or as instructed.    MEDICATIONS  You may resume your normal medications.   WHAT YOU CAN EXPECT TODAY  Some feelings of bloating in the abdomen.   Passage of more gas than usual.    IF YOU HAD A BIOPSY TAKEN DURING THE UPPER ENDOSCOPY:  Eat a soft diet IF YOU HAVE NAUSEA,  BLOATING, ABDOMINAL PAIN, OR VOMITING.    FINDING OUT THE RESULTS OF YOUR TEST Not all test results are available during your visit. DR. Darrick Penna WILL CALL YOU WITHIN 14 DAYS OF YOUR PROCEDUE WITH YOUR RESULTS. Do not assume everything is normal if you have not heard from DR. FIELDS IN ONE WEEK, CALL HER OFFICE AT [phone].  SEEK IMMEDIATE MEDICAL ATTENTION AND CALL THE OFFICE: [phone] IF:  You have more than a spotting of blood in your stool.   Your belly is swollen (abdominal distention).   You are nauseated or vomiting.   You have a temperature over 101F.   You have abdominal pain or discomfort that is severe or gets worse throughout the day.   Gastritis  Gastritis is an inflammation (the body's way of reacting to injury and/or infection) of the stomach. It is often caused by bacterial (germ) infections. It can also be caused BY ASPIRIN, BC/GOODY POWDER'S, (IBUPROFEN) MOTRIN, OR ALEVE (NAPROXEN), chemicals (including alcohol), SPICY FOODS, and medications. This illness may be associated with generalized malaise (feeling tired, not well), UPPER ABDOMINAL STOMACH cramps, and fever. One common bacterial cause of gastritis is an organism known as H. Pylori. This can be treated with antibiotics.     Low-Fat Diet BREADS, CEREALS, PASTA, RICE, DRIED PEAS, AND BEANS These products are high in carbohydrates and most are low in fat. Therefore, they can be increased in the diet as substitutes  for fatty foods. They too, however, contain calories and should not be eaten in excess. Cereals can be eaten for snacks as well as for breakfast.  Include foods that contain fiber (fruits, vegetables, whole grains, and legumes). Research shows that fiber may lower blood cholesterol levels, especially the water-soluble fiber found in fruits, vegetables, oat products, and legumes. FRUITS AND VEGETABLES It is good to eat fruits and vegetables. Besides being sources of fiber, both are rich in vitamins  and some minerals. They help you get the daily allowances of these nutrients. Fruits and vegetables can be used for snacks and desserts. MEATS Limit lean meat, chicken, [place], and fish to no more than 6 ounces per day. Beef, Pork, and Lamb Use lean cuts of beef, pork, and lamb. Lean cuts include:  Extra-lean ground beef.  Arm roast.  Sirloin tip.  Center-cut ham.  Round steak.  Loin chops.  Rump roast.  Tenderloin.  Trim all fat off the outside of meats before cooking. It is not necessary to severely decrease the intake of red meat, but lean choices should be made. Lean meat is rich in protein and contains a highly absorbable form of iron. Premenopausal women, in particular, should avoid reducing lean red meat because this could increase the risk for low red blood cells (iron-deficiency anemia).  Chicken and [place] These are good sources of protein. The fat of poultry can be reduced by removing the skin and underlying fat layers before cooking. Chicken and [place] can be substituted for lean red meat in the diet. Poultry should not be fried or covered with high-fat sauces. Fish and Shellfish Fish is a good source of protein. Shellfish contain cholesterol, but they usually are low in saturated fatty acids. The preparation of fish is important. Like chicken and [place], they should not be fried or covered with high-fat sauces. EGGS Egg whites contain no fat or cholesterol. They can be eaten often. Try 1 to 2 egg whites instead of whole eggs in recipes or use egg substitutes that do not contain yolk.  MILK AND DAIRY PRODUCTS Use skim or 1% milk instead of 2% or whole milk. Decrease whole milk, natural, and processed cheeses. Use nonfat or low-fat (2%) cottage cheese or low-fat cheeses made from vegetable oils. Choose nonfat or low-fat (1 to 2%) yogurt. Experiment with evaporated skim milk in recipes that call for heavy cream. Substitute low-fat yogurt or low-fat cottage cheese for sour cream in  dips and salad dressings. Have at least 2 servings of low-fat dairy products, such as 2 glasses of skim (or 1%) milk each day to help get your daily calcium intake.  FATS AND OILS Butterfat, lard, and beef fats are high in saturated fat and cholesterol. These should be avoided.Vegetable fats do not contain cholesterol. AVOID coconut oil, palm oil, and palm kernel oil, WHICH are very high in saturated fats. These should be limited. These fats are often used in bakery goods, processed foods, popcorn, oils, and nondairy creamers. Vegetable shortenings and some peanut butters contain hydrogenated oils, which are also saturated fats. Read the labels on these foods and check for saturated vegetable oils.  Desirable liquid vegetable oils are corn oil, cottonseed oil, olive oil, canola oil, safflower oil, soybean oil, and sunflower oil. Peanut oil is not as good, but small amounts are acceptable. Buy a heart-healthy tub margarine that has no partially hydrogenated oils in the ingredients. AVOID Mayonnaise and salad dressings often are made from unsaturated fats.  OTHER EATING TIPS Snacks  Most sweets should be limited as snacks. They tend to be rich in calories and fats, and their caloric content outweighs their nutritional value. Some good choices in snacks are graham crackers, melba toast, soda crackers, bagels (no egg), English muffins, fruits, and vegetables. These snacks are preferable to snack crackers, [place] fries, and chips. Popcorn should be air-popped or cooked in small amounts of liquid vegetable oil.  Desserts Eat fruit, low-fat yogurt, and fruit ices instead of pastries, cake, and cookies. Sherbet, angel food cake, gelatin dessert, frozen low-fat yogurt, or other frozen products that do not contain saturated fat (pure fruit juice bars, frozen ice pops) are also acceptable.   COOKING METHODS Choose those methods that use little or no fat. They include: Poaching.  Braising.  Steaming.    Grilling.  Baking.  Stir-frying.  Broiling.  Microwaving.  Foods can be cooked in a nonstick pan without added fat, or use a nonfat cooking spray in regular cookware. Limit fried foods and avoid frying in saturated fat. Add moisture to lean meats by using water, broth, cooking wines, and other nonfat or low-fat sauces along with the cooking methods mentioned above. Soups and stews should be chilled after cooking. The fat that forms on top after a few hours in the refrigerator should be skimmed off. When preparing meals, avoid using excess salt. Salt can contribute to raising blood pressure in some people.  EATING AWAY FROM HOME Order entres, potatoes, and vegetables without sauces or butter. When meat exceeds the size of a deck of cards (3 to 4 ounces), the rest can be taken home for another meal. Choose vegetable or fruit salads and ask for low-calorie salad dressings to be served on the side. Use dressings sparingly. Limit high-fat toppings, such as bacon, crumbled eggs, cheese, sunflower seeds, and olives. Ask for heart-healthy tub margarine instead of butter.  Hiatal Hernia A hiatal hernia occurs when a part of the stomach slides above the diaphragm. The diaphragm is the thin muscle separating the belly (abdomen) from the chest. A hiatal hernia can be something you are born with or develop over time. Hiatal hernias may allow stomach acid to flow back into your esophagus, the tube which carries food from your mouth to your stomach. If this acid causes problems it is called GERD (gastro-esophageal reflux disease).   SYMPTOMS Common symptoms of GERD are heartburn (burning in your chest). This is worse when lying down or bending over. It may also cause belching and indigestion. Some of the things which make GERD worse are:  HOME CARE INSTRUCTIONS  Try to achieve and maintain an ideal body weight.   Avoid drinking alcoholic beverages.   DO NOT smokE.   Do not wear tight clothing around  your chest or stomach.   Eat smaller meals and eat more frequently. This keeps your stomach from getting too full. Eat slowly.   Do not lie down for 2 or 3 hours after eating. Do not eat or drink anything 1 to 2 hours before going to bed.   Avoid caffeine beverages (colas, coffee, cocoa, tea), fatty foods, citrus fruits and all other foods and drinks that contain acid and that seem to increase the problems.   Avoid bending over, especially after eating OR STRAINING. Anything that increases the pressure in your belly increases the amount of acid that may be pushed up into your esophagus.

## 2016-10-23 NOTE — Op Note (Signed)
[facility] Patient Name: Tracey Mccarthy Procedure Date: 10/23/2016 8:07 AM MRN: [id number] Date of Birth: 1965/02/19 Attending MD: Jonette Eva MD, MD CSN: [id number] Age: 51 Admit Type: Outpatient Procedure:                Upper GI endoscopy WITH GIVENS CAPSULE PLACEMENT Indications:              Iron deficiency anemia(JUL 2018: Hb 9.5, MCV 64.5,                            FERRITIN 10). LAST SREENING TCS: APR 2016-EH, EGD                            DEC 2017: LARGE HH AND GASTRIC POLYPS, JAN 2018:                            UGI-ESO MOTILITY D/O, GERD, HH. WEIGHT STABLE: 255                            LBS. Providers:                Jonette Eva MD, MD, Criselda Peaches. Mathis Fare RN, RN, Edrick Kins, RN Referring MD:             Ignatius Specking Medicines:                Promethazine 12.5 mg IV, Meperidine 75 mg IV,                            Midazolam 5 mg IV Complications:            No immediate complications. Estimated Blood Loss:     Estimated blood loss was minimal. Procedure:                Pre-Anesthesia Assessment:                           - Prior to the procedure, a History and Physical                            was performed, and patient medications and                            allergies were reviewed. The patient's tolerance of                            previous anesthesia was also reviewed. The risks                            and benefits of the procedure and the sedation                            options and risks were discussed with the patient.  All questions were answered, and informed consent                            was obtained. Prior Anticoagulants: The patient has                            taken no previous anticoagulant or antiplatelet                            agents. ASA Grade Assessment: II - A patient with                            mild systemic disease. After reviewing the risks      and benefits, the patient was deemed in                            satisfactory condition to undergo the procedure.                            After obtaining informed consent, the endoscope was                            passed under direct vision. Throughout the                            procedure, the patient's blood pressure, pulse, and                            oxygen saturations were monitored continuously. The                            [id number] ([id number]) scope was introduced through the                            mouth, and advanced to the second part of duodenum.                            GIVENS CAPSULE APPLIED AND AGIAN SCOPE INTRODUCED                            UNDER PARTIALLY OBSCURED VISUALIZATION & ADVANCED                            TO THE DUODENAL BULB. The upper GI endoscopy was                            somewhat difficult due to the patient's agitation                            WITH GIVENS CAPSULE PLACEMENT. Successful                            completion of the procedure was aided by increasing  the dose of sedation medication. The patient                            tolerated the procedure fairly well. Scope In: 8:59:55 AM Scope Out: 9:10:32 AM Total Procedure Duration: 0 hours 10 minutes 37 seconds  Findings:      The examined esophagus was normal.      A large hiatal hernia was present.      Multiple small sessile polyps were found in the gastric fundus.      The examined duodenum was normal.      -GIVENS CAPSULE INTRODUCED INTO THE DUODENUM. MILD TRAUMA IN HYPOPHARYNX. Impression:               - Normal esophagus.                           - Large hiatal hernia.                           - Multiple gastric polyps.                           - MILD SCOPE TRAUMA IN DUODENUM AND IN HYPOPHARYNX                            DURING GIVENS CAPSULE PLACEMENT. Moderate Sedation:      Moderate (conscious) sedation was administered by the  endoscopy nurse       and supervised by the endoscopist. The following parameters were       monitored: oxygen saturation, heart rate, blood pressure, and response       to care. Total physician intraservice time was 19 minutes. Recommendation:           - NPO and THEN clear liquid diet IN 2 HOURS THEN                            RESUME PREVIOUS DIET.Marland Kitchen                           - Continue present medications. CHECK CBC/FERRITIN                            TODAY.                           - Return to my office in 3 months.                           - Patient has a contact number available for                            emergencies. The signs and symptoms of potential                            delayed complications were discussed with the                            patient.  Return to normal activities tomorrow.                            Written discharge instructions were provided to the                            patient. Procedure Code(s):        --- Professional ---                           [phone], Esophagogastroduodenoscopy, flexible,                            transoral; diagnostic, including collection of                            specimen(s) by brushing or washing, when performed                            (separate procedure)                           99152, Moderate sedation services provided by the                            same physician or other qualified health care                            professional performing the diagnostic or                            therapeutic service that the sedation supports,                            requiring the presence of an independent trained                            observer to assist in the monitoring of the                            patient's level of consciousness and physiological                            status; initial 15 minutes of intraservice time,                            patient age 56 years or older Diagnosis  Code(s):        --- Professional ---                           K44.9, Diaphragmatic hernia without obstruction or                            gangrene  K31.7, Polyp of stomach and duodenum                           D50.9, Iron deficiency anemia, unspecified CPT copyright 2016 [employer]. All rights reserved. The codes documented in this report are preliminary and upon coder review may  be revised to meet current compliance requirements. Jonette Eva, MD Jonette Eva MD, MD 10/23/2016 9:28:16 AM This report has been signed electronically. Number of Addenda: 0

## 2016-10-29 ENCOUNTER — Telehealth: Payer: Self-pay | Admitting: Gastroenterology

## 2016-10-29 NOTE — Procedures (Signed)
  PATIENT DATA: GASTRIC PASSAGE TIME: 0 m, SB PASSAGE TIME: 4H 47m  RESULTS: LIMITED views of gastric mucosa due to CAPSULE PLACED VIA EGD.  No ULCERS, masses or AVMs seen IN THE SMALL BOWEL. SMALL AMOUNT OF FRESH BLOOD ON FIRST DUODENAL IMAGE DUE TO TRAUMA INDUCED BY ENDOSCOPE/CAPSULE DEVICE.  LIMITED VIEWS OF THE COLON DUE TO RETAINED CONTENTS. OTHERWISE, no old blood or fresh blood in the small bowel, or colon.  DIAGNOSIS:  NO SOURCE FOR IRON DEFICIENCY ANEMIA IDENTIFIED.  Plan: 1.REPEAT TCS TO COMPLETE EVALUATION FOR IRON DEFICIENCY ANEMIA. 2. CBC IN ONE MO. 3. HEMATOLOGY REFERRAL FOR IVFE 4. OPV IN 4 MOS

## 2016-10-29 NOTE — Telephone Encounter (Addendum)
PLEASE CALL PT. NO SOURCE FOR HER IRON DEFICIENCY ANEMIA WAS IDENTIFIED ON THE UPPER ENDOSCOPY OF GIVENS CAPSULE.  SHE NEEDS: 1.REPEAT TCS TO COMPLETE EVALUATION FOR IRON DEFICIENCY ANEMIA. CLEAR LIQUIDS ON DAY PRIOR TO TCS. PHENERGAN 12.5 MG IV IN PREOP. SUPREP SPLIT DOSING. HOLD IRON 7 DAYS.  2. CBC IN ONE MO. 3. HEMATOLOGY REFERRAL FOR IVFE, DX: IRON DEFICIENCY ANEMIA. 4. OPV IN 4 MOS E30 IRON DEFICIENCY ANEMIA.

## 2016-10-30 ENCOUNTER — Other Ambulatory Visit: Payer: Self-pay

## 2016-10-30 ENCOUNTER — Encounter: Payer: Self-pay | Admitting: Gastroenterology

## 2016-10-30 DIAGNOSIS — D509 Iron deficiency anemia, unspecified: Secondary | ICD-10-CM

## 2016-10-30 DIAGNOSIS — D649 Anemia, unspecified: Secondary | ICD-10-CM

## 2016-10-30 MED ORDER — NA SULFATE-K SULFATE-MG SULF 17.5-3.13-1.6 GM/177ML PO SOLN: 1.0000 | ORAL | 0 refills | Status: DC

## 2016-10-30 NOTE — Telephone Encounter (Signed)
Pt is aware. CBC on file for 1 month. Forwarding to [facility] to schedule the colonoscopy and make the referral. To D[facility] to make the appt.

## 2016-10-30 NOTE — Telephone Encounter (Signed)
PA info for TCS submitted via [facility] website. No PA needed. Decision ID# [id number].

## 2016-10-30 NOTE — Telephone Encounter (Signed)
PATIENT SCHEDULED  °

## 2016-10-30 NOTE — Telephone Encounter (Signed)
Called pt. Colonoscopy scheduled for 11/20/16 at 12:00pm with [facility]. Informed pt to hold Iron for 7 days before colonoscopy. Rx for prep sent to pharmacy. Instructions mailed. Orders entered.  Referral sent to hematology via Epic.

## 2016-10-31 ENCOUNTER — Encounter (HOSPITAL_COMMUNITY): Payer: Self-pay | Admitting: Gastroenterology

## 2016-11-13 ENCOUNTER — Telehealth: Payer: Self-pay | Admitting: Gastroenterology

## 2016-11-13 NOTE — Telephone Encounter (Signed)
[phone] please call patient, she has questions about her procedure

## 2016-11-13 NOTE — Telephone Encounter (Signed)
Called pt. She said she picked up her prep and wanted to know when to start drinking it. Asked pt if she had received her instructions in the mail and she said yes. Told her everything is on her instructions as to date/time to drink the prep. Started to further explain and she hung up the phone. Called number back. A female answered the phone and said she got upset and she wanted to know when to start drinking the prep. Informed him that everything is on the instructions and I was trying to explain it to her. Informed him of dates/times for her to drink prep.

## 2016-11-20 ENCOUNTER — Ambulatory Visit (HOSPITAL_COMMUNITY)
Admission: RE | Admit: 2016-11-20 | Discharge: 2016-11-20 | Disposition: A | Discharge status: Home / Self Care | Payer: Medicare Other | Source: Ambulatory Visit | Attending: Gastroenterology | Admitting: Gastroenterology

## 2016-11-20 ENCOUNTER — Encounter (HOSPITAL_COMMUNITY): Payer: Self-pay | Admitting: *Deleted

## 2016-11-20 ENCOUNTER — Encounter (HOSPITAL_COMMUNITY)
Admission: RE | Disposition: A | Discharge status: Home / Self Care | Payer: Self-pay | Source: Ambulatory Visit | Attending: Gastroenterology

## 2016-11-20 ENCOUNTER — Telehealth: Payer: Self-pay | Admitting: Gastroenterology

## 2016-11-20 DIAGNOSIS — Q438 Other specified congenital malformations of intestine: Secondary | ICD-10-CM | POA: Diagnosis not present

## 2016-11-20 DIAGNOSIS — F419 Anxiety disorder, unspecified: Secondary | ICD-10-CM | POA: Insufficient documentation

## 2016-11-20 DIAGNOSIS — Z90710 Acquired absence of both cervix and uterus: Secondary | ICD-10-CM | POA: Diagnosis not present

## 2016-11-20 DIAGNOSIS — F329 Major depressive disorder, single episode, unspecified: Secondary | ICD-10-CM | POA: Diagnosis not present

## 2016-11-20 DIAGNOSIS — I10 Essential (primary) hypertension: Secondary | ICD-10-CM | POA: Insufficient documentation

## 2016-11-20 DIAGNOSIS — D509 Iron deficiency anemia, unspecified: Secondary | ICD-10-CM

## 2016-11-20 DIAGNOSIS — Z8249 Family history of ischemic heart disease and other diseases of the circulatory system: Secondary | ICD-10-CM | POA: Diagnosis not present

## 2016-11-20 DIAGNOSIS — Z79899 Other long term (current) drug therapy: Secondary | ICD-10-CM | POA: Diagnosis not present

## 2016-11-20 DIAGNOSIS — Z9889 Other specified postprocedural states: Secondary | ICD-10-CM | POA: Diagnosis not present

## 2016-11-20 DIAGNOSIS — E669 Obesity, unspecified: Secondary | ICD-10-CM | POA: Insufficient documentation

## 2016-11-20 DIAGNOSIS — K644 Residual hemorrhoidal skin tags: Secondary | ICD-10-CM | POA: Insufficient documentation

## 2016-11-20 DIAGNOSIS — H469 Unspecified optic neuritis: Secondary | ICD-10-CM | POA: Insufficient documentation

## 2016-11-20 DIAGNOSIS — M069 Rheumatoid arthritis, unspecified: Secondary | ICD-10-CM | POA: Insufficient documentation

## 2016-11-20 DIAGNOSIS — Z800 Family history of malignant neoplasm of digestive organs: Secondary | ICD-10-CM | POA: Insufficient documentation

## 2016-11-20 DIAGNOSIS — Z833 Family history of diabetes mellitus: Secondary | ICD-10-CM | POA: Insufficient documentation

## 2016-11-20 DIAGNOSIS — K219 Gastro-esophageal reflux disease without esophagitis: Secondary | ICD-10-CM | POA: Diagnosis not present

## 2016-11-20 HISTORY — PX: COLONOSCOPY: SHX5424

## 2016-11-20 SURGERY — COLONOSCOPY
Anesthesia: Moderate Sedation

## 2016-11-20 MED ORDER — PROMETHAZINE HCL 25 MG/ML IJ SOLN
INTRAMUSCULAR | Status: AC
  Filled 2016-11-20: qty 1

## 2016-11-20 MED ORDER — PROMETHAZINE HCL 25 MG/ML IJ SOLN
12.5000 mg | Freq: Once | INTRAMUSCULAR | Status: AC
  Administered 2016-11-20: 12.5 mg via INTRAVENOUS

## 2016-11-20 MED ORDER — SODIUM CHLORIDE 0.9% FLUSH
INTRAVENOUS | Status: DC
Start: 2016-11-20 — End: 2016-11-20
  Filled 2016-11-20: qty 10

## 2016-11-20 MED ORDER — MIDAZOLAM HCL 5 MG/5ML IJ SOLN
INTRAMUSCULAR | Status: AC
  Filled 2016-11-20: qty 10

## 2016-11-20 MED ORDER — SODIUM CHLORIDE 0.9 % IV SOLN
INTRAVENOUS | Status: DC
  Administered 2016-11-20: 11:00:00 via INTRAVENOUS

## 2016-11-20 MED ORDER — MEPERIDINE HCL 100 MG/ML IJ SOLN
INTRAMUSCULAR | Status: DC | PRN
  Administered 2016-11-20: 25 mg
  Administered 2016-11-20: 50 mg

## 2016-11-20 MED ORDER — MEPERIDINE HCL 100 MG/ML IJ SOLN
INTRAMUSCULAR | Status: AC
  Filled 2016-11-20: qty 2

## 2016-11-20 MED ORDER — MIDAZOLAM HCL 5 MG/5ML IJ SOLN
INTRAMUSCULAR | Status: DC | PRN
  Administered 2016-11-20: 1 mg via INTRAVENOUS
  Administered 2016-11-20 (×2): 2 mg via INTRAVENOUS

## 2016-11-20 NOTE — Telephone Encounter (Addendum)
Referral was placed to hematology via Epic

## 2016-11-20 NOTE — Addendum Note (Signed)
Addended by: Tommie Sams on: 11/20/2016 02:34 PM   Modules accepted: Orders

## 2016-11-20 NOTE — Op Note (Signed)
[facility] Patient Name: Tracey Mccarthy Procedure Date: 11/20/2016 11:44 AM MRN: [id number] Date of Birth: [birth year] Attending MD: Jonette Eva MD, MD CSN: [id number] Age: 51 Admit Type: Outpatient Procedure:                Colonoscopy, DIAGNOSTIC Indications:              Iron deficiency anemia(FERRITIN 07 Oct 2016) Providers:                Jonette Eva MD, MD, Virgie Dad, Burke Keels, Technician Referring MD:             Ignatius Specking Medicines:                Meperidine 75 mg IV, Midazolam 5 mg IV Complications:            No immediate complications. Estimated Blood Loss:     Estimated blood loss: none. Procedure:                Pre-Anesthesia Assessment:                           - Prior to the procedure, a History and Physical                            was performed, and patient medications and                            allergies were reviewed. The patient's tolerance of                            previous anesthesia was also reviewed. The risks                            and benefits of the procedure and the sedation                            options and risks were discussed with the patient.                            All questions were answered, and informed consent                            was obtained. Prior Anticoagulants: The patient has                            taken no previous anticoagulant or antiplatelet                            agents. ASA Grade Assessment: II - A patient with                            mild systemic disease. After reviewing the risks  and benefits, the patient was deemed in                            satisfactory condition to undergo the procedure.                            After obtaining informed consent, the colonoscope                            was passed under direct vision. Throughout the                            procedure, the patient's blood pressure, pulse, and                             oxygen saturations were monitored continuously. The                            [id number] ([id number]) scope was introduced through                            the anus and advanced to the 2 cm into the ileum.                            The colonoscopy was technically difficult and                            complex due to poor endoscopic visualization IN                            RIGHT COLON and a tortuous colon. Successful                            completion of the procedure was aided by increasing                            the dose of sedation medication, lavage OF RIGHT                            COLON and COLOWRAP. The patient tolerated the                            procedure well. The quality of the bowel                            preparation was good IN LEFT COLON and fair IN                            RIGHT COLON. The terminal ileum, ileocecal valve,                            appendiceal orifice, and rectum were photographed. Scope In: 12:16:06 PM Scope Out: 12:30:25 PM Scope Withdrawal Time: 0  hours 11 minutes 35 seconds  Total Procedure Duration: 0 hours 14 minutes 19 seconds  Findings:      The recto-sigmoid colon, sigmoid colon and descending colon were mildly       redundant.      The exam was otherwise without abnormality.      External hemorrhoids were found during retroflexion. The hemorrhoids       were moderate. Impression:               - Preparation of the colon was fair IN RIGHT COLON                           - Redundant LEFT colon.                           - The examination was otherwise normal.                           - External hemorrhoids.                           - NO SOURCE FOR IRON DEFICIENCY ANEMIA IDENTIFIED. Moderate Sedation:      Moderate (conscious) sedation was administered by the endoscopy nurse       and supervised by the endoscopist. The following parameters were       monitored: oxygen saturation, heart rate,  blood pressure, and response       to care. Total physician intraservice time was 34 minutes. Recommendation:           - Repeat colonoscopy in 5 years for surveillance                            DUE TO FAIR BOWEL PREP IN RIGHT COLON.                           - High fiber diet.                           - Continue present medications.                           - Return to my office in 3 months.                           - REFER TO HEMATOLOGY                           - Patient has a contact number available for                            emergencies. The signs and symptoms of potential                            delayed complications were discussed with the                            patient. Return to normal activities tomorrow.  Written discharge instructions were provided to the                            patient. Procedure Code(s):        --- Professional ---                           [phone], Colonoscopy, flexible; diagnostic, including                            collection of specimen(s) by brushing or washing,                            when performed (separate procedure)                           99152, Moderate sedation services provided by the                            same physician or other qualified health care                            professional performing the diagnostic or                            therapeutic service that the sedation supports,                            requiring the presence of an independent trained                            observer to assist in the monitoring of the                            patient's level of consciousness and physiological                            status; initial 15 minutes of intraservice time,                            patient age 30 years or older                           [phone], Moderate sedation services; each additional                            15 minutes intraservice time Diagnosis  Code(s):        --- Professional ---                           K64.4, Residual hemorrhoidal skin tags                           D50.9, Iron deficiency anemia, unspecified  Q43.8, Other specified congenital malformations of                            intestine CPT copyright 2016 American Medical Association. All rights reserved. The codes documented in this report are preliminary and upon coder review may  be revised to meet current compliance requirements. Jonette Eva, MD Jonette Eva MD, MD 11/20/2016 12:45:29 PM This report has been signed electronically. Number of Addenda: 0

## 2016-11-20 NOTE — Telephone Encounter (Signed)
REFER TO HEMATOLOGY FOR IVFE, Dx: IRON DEFICIENCY ANEMIA.

## 2016-11-20 NOTE — Discharge Instructions (Signed)
You have SMALL internal hemorrhoids. YOU DID NOT HAVE ANY POLYPS.   DRINK WATER TO KEEP YOUR URINE LIGHT YELLOW.  FOLLOW A HIGH FIBER DIET. AVOID ITEMS THAT CAUSE BLOATING. SEE INFO BELOW.  USE PREPARATION H FOUR TIMES  A DAY IF NEEDED TO RELIEVE RECTAL PAIN/PRESSURE/BLEEDING.   GET YOUR BLOOD CHECKED NEXT WEEK.  SEE HEMATOLOGY TO GET IV IRON.  FOLLOW UP IN JAN 2019.  Next colonoscopy in 5 years.  Colonoscopy Care After Read the instructions outlined below and refer to this sheet in the next week. These discharge instructions provide you with general information on caring for yourself after you leave the hospital. While your treatment has been planned according to the most current medical practices available, unavoidable complications occasionally occur. If you have any problems or questions after discharge, call DR. Shonteria Abeln, [phone].  ACTIVITY  You may resume your regular activity, but move at a slower pace for the next 24 hours.   Take frequent rest periods for the next 24 hours.   Walking will help get rid of the air and reduce the bloated feeling in your belly (abdomen).   No driving for 24 hours (because of the medicine (anesthesia) used during the test).   You may shower.   Do not sign any important legal documents or operate any machinery for 24 hours (because of the anesthesia used during the test).    NUTRITION  Drink plenty of fluids.   You may resume your normal diet as instructed by your doctor.   Begin with a light meal and progress to your normal diet. Heavy or fried foods are harder to digest and may make you feel sick to your stomach (nauseated).   Avoid alcoholic beverages for 24 hours or as instructed.    MEDICATIONS  You may resume your normal medications.   WHAT YOU CAN EXPECT TODAY  Some feelings of bloating in the abdomen.   Passage of more gas than usual.   Spotting of blood in your stool or on the toilet paper  .  IF YOU HAD  POLYPS REMOVED DURING THE COLONOSCOPY:  Eat a soft diet IF YOU HAVE NAUSEA, BLOATING, ABDOMINAL PAIN, OR VOMITING.    FINDING OUT THE RESULTS OF YOUR TEST Not all test results are available during your visit. DR. Darrick Penna WILL CALL YOU WITHIN 14 DAYS OF YOUR PROCEDUE WITH YOUR RESULTS. Do not assume everything is normal if you have not heard from DR. Edon Hoadley, CALL HER OFFICE AT [phone].  SEEK IMMEDIATE MEDICAL ATTENTION AND CALL THE OFFICE: [phone] IF:  You have more than a spotting of blood in your stool.   Your belly is swollen (abdominal distention).   You are nauseated or vomiting.   You have a temperature over 101F.   You have abdominal pain or discomfort that is severe or gets worse throughout the day.  High-Fiber Diet A high-fiber diet changes your normal diet to include more whole grains, legumes, fruits, and vegetables. Changes in the diet involve replacing refined carbohydrates with unrefined foods. The calorie level of the diet is essentially unchanged. The Dietary Reference Intake (recommended amount) for adult males is 38 grams per day. For adult females, it is 25 grams per day. Pregnant and lactating women should consume 28 grams of fiber per day. Fiber is the intact part of a plant that is not broken down during digestion. Functional fiber is fiber that has been isolated from the plant to provide a beneficial effect in the body. PURPOSE  Increase stool bulk.   Ease and regulate bowel movements.   Lower cholesterol.   REDUCE RISK OF COLON CANCER  INDICATIONS THAT YOU NEED MORE FIBER  Constipation and hemorrhoids.   Uncomplicated diverticulosis (intestine condition) and irritable bowel syndrome.   Weight management.   As a protective measure against hardening of the arteries (atherosclerosis), diabetes, and cancer.   GUIDELINES FOR INCREASING FIBER IN THE DIET  Start adding fiber to the diet slowly. A gradual increase of about 5 more grams (2 slices of  whole-wheat bread, 2 servings of most fruits or vegetables, or 1 bowl of high-fiber cereal) per day is best. Too rapid an increase in fiber may result in constipation, flatulence, and bloating.   Drink enough water and fluids to keep your urine clear or pale yellow. Water, juice, or caffeine-free drinks are recommended. Not drinking enough fluid may cause constipation.   Eat a variety of high-fiber foods rather than one type of fiber.   Try to increase your intake of fiber through using high-fiber foods rather than fiber pills or supplements that contain small amounts of fiber.   The goal is to change the types of food eaten. Do not supplement your present diet with high-fiber foods, but replace foods in your present diet.    INCLUDE A VARIETY OF FIBER SOURCES  Replace refined and processed grains with whole grains, canned fruits with fresh fruits, and incorporate other fiber sources. White rice, white breads, and most bakery goods contain little or no fiber.   Brown whole-grain rice, buckwheat oats, and many fruits and vegetables are all good sources of fiber. These include: broccoli, Brussels sprouts, cabbage, cauliflower, beets, sweet potatoes, white potatoes (skin on), carrots, tomatoes, eggplant, squash, berries, fresh fruits, and dried fruits.   Cereals appear to be the richest source of fiber. Cereal fiber is found in whole grains and bran. Bran is the fiber-rich outer coat of cereal grain, which is largely removed in refining. In whole-grain cereals, the bran remains. In breakfast cereals, the largest amount of fiber is found in those with "bran" in their names. The fiber content is sometimes indicated on the label.   You may need to include additional fruits and vegetables each day.   In baking, for 1 cup white flour, you may use the following substitutions:   1 cup whole-wheat flour minus 2 tablespoons.   1/2 cup white flour plus 1/2 cup whole-wheat flour.    Hemorrhoids Hemorrhoids are dilated (enlarged) veins around the rectum. Sometimes clots will form in the veins. This makes them swollen and painful. These are called thrombosed hemorrhoids. Causes of hemorrhoids include:  Constipation.   Straining to have a bowel movement.   HEAVY LIFTING  HOME CARE INSTRUCTIONS  Eat a well balanced diet and drink 6 to 8 glasses of water every day to avoid constipation. You may also use a bulk laxative.   Avoid straining to have bowel movements.   Keep anal area dry and clean.   Do not use a donut shaped pillow or sit on the toilet for long periods. This increases blood pooling and pain.   Move your bowels when your body has the urge; this will require less straining and will decrease pain and pressure.

## 2016-11-20 NOTE — H&P (Addendum)
Primary Care Physician:  Glenda Chroman, MD Primary Gastroenterologist:  Dr. Oneida Alar  Pre-Procedure History & Physical: HPI:  Tracey Mccarthy is a 51 y.o. female here for [place].  Past Medical History:  Diagnosis Date  . Anxiety   . Degenerative arthritis   . Depression   . GERD (gastroesophageal reflux disease)   . Headache   . Hypertension   . Obese   . Optic neuritis   . Rheumatoid arthritis [facility])     Past Surgical History:  Procedure Laterality Date  . ABDOMINAL HYSTERECTOMY    . arm surgery Right    knot removed  . BIOPSY  01/18/2016   Procedure: BIOPSY;  Surgeon: Danie Binder, MD;  [facility];  Service: Endoscopy;;  gastric  . COLONOSCOPY N/A 05/18/2014   Dr. Oneida Alar: Moderate sized external hemorrhoids, normal terminal ileum and colon. Next colonoscopy 2026  . ESOPHAGOGASTRODUODENOSCOPY N/A 01/18/2016   Procedure: ESOPHAGOGASTRODUODENOSCOPY (EGD);  Surgeon: Danie Binder, MD;  [facility];  Service: Endoscopy;  Laterality: N/A;  145  . ESOPHAGOGASTRODUODENOSCOPY N/A 10/23/2016   Procedure: ESOPHAGOGASTRODUODENOSCOPY (EGD);  Surgeon: Danie Binder, MD;  [facility];  Service: Endoscopy;  Laterality: N/A;  8:30am  . GIVENS CAPSULE STUDY N/A 10/23/2016   Procedure: GIVENS CAPSULE STUDY;  Surgeon: Danie Binder, MD;  [facility];  Service: Endoscopy;  Laterality: N/A;  . KNEE SURGERY Left   . SAVORY DILATION N/A 01/18/2016   Procedure: SAVORY DILATION;  Surgeon: Danie Binder, MD;  [facility];  Service: Endoscopy;  Laterality: N/A;    Prior to Admission medications   Medication Sig Start Date End Date Taking? Authorizing Provider  carbamazepine (TEGRETOL XR) 400 MG 12 hr tablet Take 400 mg by mouth at bedtime.       citalopram (CELEXA) 10 MG tablet Take 10 mg by mouth daily.       doxepin (SINEQUAN) 10 MG capsule Take 10 mg by mouth at bedtime.       furosemide (LASIX) 20 MG tablet Take 20  mg by mouth daily.       metoprolol tartrate (LOPRESSOR) 50 MG tablet Take 50 mg by mouth 2 (two) times daily.      Na Sulfate-K Sulfate-Mg Sulf (SUPREP BOWEL PREP KIT) 17.5-3.13-1.6 GM/180ML SOLN Take 1 kit by mouth as directed.      potassium chloride (K-DUR) 10 MEQ tablet Take 10 mEq by mouth daily.      tetrahydrozoline 0.05 % ophthalmic solution Place 1 drop into both eyes 3 (three) times daily as needed (for dry eyes.).      tiZANidine (ZANAFLEX) 4 MG tablet Take 4 mg by mouth 3 (three) times daily as needed for muscle spasms.      traMADol (ULTRAM) 50 MG tablet Take 50 mg by mouth every 8 (eight) hours as needed. For pain.      omeprazole (PRILOSEC) 20 MG capsule 1 po 30 mind prior to first meal Patient taking differently: Take 20 mg by mouth daily as needed (acid reflux). 1 po 30 mind prior to first meal      promethazine (PHENERGAN) 25 MG tablet Take 25 mg by mouth every 6 (six) hours as needed for nausea or vomiting.        Allergies as of 10/30/2016  . (No Known Allergies)    Family History  Problem Relation Age of Onset  . Pneumonia Father   . Diabetes Father   .  Hypertension Father   . Heart attack Father   . Colon cancer Father        greater than 23  . Colon cancer Paternal Aunt        greater than 27    Social History   Social History  . Marital status: Married    Spouse name: N/A  . Number of children: 2  . Years of education: college   Occupational History  .  Unemployed   Social History Main Topics  . Smoking status: Never Smoker  . Smokeless tobacco: Never Used  . Alcohol use No  . Drug use: No  . Sexual activity: Yes    Birth control/ protection: Surgical   Other Topics Concern  . Not on file   Social History Narrative  . No narrative on file    Review of Systems: See HPI, otherwise negative ROS   Physical Exam: BP (!) 149/77   Pulse 62   Temp 97.6 F (36.4 C) (Oral)   Resp (!) 21   SpO2 100%  General:   Alert,  pleasant and  cooperative in NAD Head:  Normocephalic and atraumatic. Neck:  Supple; Lungs:  Clear throughout to auscultation.    Heart:  Regular rate and rhythm. Abdomen:  Soft, nontender and nondistended. Normal bowel sounds, without guarding, and without rebound.   Neurologic:  Alert and  oriented x4;  grossly normal neurologically.  Impression/Plan:   iron deficiency anemia   PLAN:  1. TCS TODAY DISCUSSED PROCEDURE, BENEFITS, & RISKS: < 1% chance of medication reaction, bleeding, perforation, or rupture of spleen/liver.

## 2016-11-23 ENCOUNTER — Encounter (HOSPITAL_COMMUNITY): Payer: Self-pay | Admitting: Gastroenterology

## 2016-11-23 ENCOUNTER — Other Ambulatory Visit (HOSPITAL_COMMUNITY): Payer: Self-pay | Admitting: Oncology

## 2016-11-23 ENCOUNTER — Encounter (HOSPITAL_COMMUNITY): Payer: Medicare Other | Attending: Oncology | Admitting: Oncology

## 2016-11-23 ENCOUNTER — Encounter (HOSPITAL_COMMUNITY): Payer: Medicare Other

## 2016-11-23 VITALS — BP 146/79 | HR 67 | Temp 98.0°F | Resp 18 | Ht 62.0 in | Wt 249.0 lb

## 2016-11-23 DIAGNOSIS — Z79899 Other long term (current) drug therapy: Secondary | ICD-10-CM | POA: Insufficient documentation

## 2016-11-23 DIAGNOSIS — F419 Anxiety disorder, unspecified: Secondary | ICD-10-CM | POA: Diagnosis not present

## 2016-11-23 DIAGNOSIS — K219 Gastro-esophageal reflux disease without esophagitis: Secondary | ICD-10-CM | POA: Diagnosis not present

## 2016-11-23 DIAGNOSIS — D508 Other iron deficiency anemias: Secondary | ICD-10-CM

## 2016-11-23 DIAGNOSIS — Z90710 Acquired absence of both cervix and uterus: Secondary | ICD-10-CM | POA: Diagnosis not present

## 2016-11-23 DIAGNOSIS — F329 Major depressive disorder, single episode, unspecified: Secondary | ICD-10-CM | POA: Insufficient documentation

## 2016-11-23 DIAGNOSIS — E669 Obesity, unspecified: Secondary | ICD-10-CM | POA: Insufficient documentation

## 2016-11-23 DIAGNOSIS — I10 Essential (primary) hypertension: Secondary | ICD-10-CM | POA: Diagnosis not present

## 2016-11-23 DIAGNOSIS — Z800 Family history of malignant neoplasm of digestive organs: Secondary | ICD-10-CM | POA: Diagnosis not present

## 2016-11-23 DIAGNOSIS — M069 Rheumatoid arthritis, unspecified: Secondary | ICD-10-CM | POA: Insufficient documentation

## 2016-11-23 DIAGNOSIS — H469 Unspecified optic neuritis: Secondary | ICD-10-CM | POA: Insufficient documentation

## 2016-11-23 DIAGNOSIS — D509 Iron deficiency anemia, unspecified: Secondary | ICD-10-CM

## 2016-11-23 LAB — COMPREHENSIVE METABOLIC PANEL
ALT: 11 U/L — ABNORMAL LOW (ref 14–54)
AST: 19 U/L (ref 15–41)
Albumin: 3.6 g/dL (ref 3.5–5.0)
Alkaline Phosphatase: 95 U/L (ref 38–126)
Anion gap: 7 (ref 5–15)
BUN: 9 mg/dL (ref 6–20)
CO2: 28 mmol/L (ref 22–32)
Calcium: 9.4 mg/dL (ref 8.9–10.3)
Chloride: 102 mmol/L (ref 101–111)
Creatinine, Ser: 1 mg/dL (ref 0.44–1.00)
GFR calc Af Amer: >60 mL/min (ref >60)
GFR calc non Af Amer: >60 mL/min (ref >60)
Glucose, Bld: 108 mg/dL — ABNORMAL HIGH (ref 65–99)
Potassium: 3.4 mmol/L — ABNORMAL LOW (ref 3.5–5.1)
Sodium: 137 mmol/L (ref 135–145)
Total Bilirubin: 0.3 mg/dL (ref 0.3–1.2)
Total Protein: 7.6 g/dL (ref 6.5–8.1)

## 2016-11-23 LAB — CBC WITH DIFFERENTIAL/PLATELET
Basophils Absolute: 0 10*3/uL (ref 0.0–0.1)
Basophils Relative: 0 %
Eosinophils Absolute: 0.2 10*3/uL (ref 0.0–0.7)
Eosinophils Relative: 3 %
HCT: 31.1 % — ABNORMAL LOW (ref 36.0–46.0)
Hemoglobin: 9.5 g/dL — ABNORMAL LOW (ref 12.0–15.0)
Lymphocytes Relative: 28 %
Lymphs Abs: 1.9 10*3/uL (ref 0.7–4.0)
MCH: 19.8 pg — ABNORMAL LOW (ref 26.0–34.0)
MCHC: 30.5 g/dL (ref 30.0–36.0)
MCV: 64.9 fL — ABNORMAL LOW (ref 78.0–100.0)
Monocytes Absolute: 0.6 10*3/uL (ref 0.1–1.0)
Monocytes Relative: 9 %
Neutro Abs: 4 10*3/uL (ref 1.7–7.7)
Neutrophils Relative %: 60 %
Platelets: 446 10*3/uL — ABNORMAL HIGH (ref 150–400)
RBC: 4.79 MIL/uL (ref 3.87–5.11)
RDW: 19.9 % — ABNORMAL HIGH (ref 11.5–15.5)
WBC: 6.7 10*3/uL (ref 4.0–10.5)

## 2016-11-23 LAB — RETICULOCYTES
RBC.: 4.79 MIL/uL (ref 3.87–5.11)
Retic Count, Absolute: 81.4 10*3/uL (ref 19.0–186.0)
Retic Ct Pct: 1.7 % (ref 0.4–3.1)

## 2016-11-23 LAB — IRON AND TIBC
Iron: 21 ug/dL — ABNORMAL LOW (ref 28–170)
Saturation Ratios: 5 % — ABNORMAL LOW (ref 10.4–31.8)
TIBC: 400 ug/dL (ref 250–450)
UIBC: 379 ug/dL

## 2016-11-23 LAB — VITAMIN B12: Vitamin B-12: 557 pg/mL (ref 180–914)

## 2016-11-23 LAB — FERRITIN: Ferritin: 6 ng/mL — ABNORMAL LOW (ref 11–307)

## 2016-11-23 NOTE — Progress Notes (Signed)
[facility] Cancer Initial Visit:  Patient Care Team: Ignatius Specking, MD as PCP - General (Internal Medicine) West Bali, MD as Consulting Physician (Gastroenterology)  CHIEF COMPLAINTS/PURPOSE OF CONSULTATION:  Iron deficiency anemia  HISTORY OF PRESENTING ILLNESS: Tracey Mccarthy 51 y.o. female Presents for iron deficiency anemia.  Most recent CBC from 10/23/2016 demonstrated WBC 6.3K, hemoglobin 8.9 g/dl, hematocrit 60.7%, MCV 65.2, platelets 337K.  Ferritin on 10/23/2016 was 8.  EGD on 10/23/2016 demonstrated normal esophagus, large hiatal hernia, multiple gastric polyps, mild scope trauma in duodenum and in hypopharynx during Givens capsule placement.  Givens capsule endoscopy was negative for ulcers, masses, AVMs, no source of bleeding was found. Colonoscopy on 11/20/2016 demonstrated preparation of the colon was fair in the right colon, redundant left colon, the examination was otherwise normal, external hemorrhoids, no source for iron deficiency anemia was identified.  Baseline hemoglobin from 3 months ago was 9.5g/dl, hemtocrit 37.1%, MCV 06.2.  One year ago hemoglobin was 11.3 g/dL, hematocrit 69.4%, MCV 69.5%.  Today patient states she feels fatigued.She states she has abdominal pain in her stomach. She denies any chest pain, shortness of breath, headaches. She denies any hematuria, hematochezia, melena. She has occasional nausea, vomiting, and diarrhea. She was on an oral iron supplement for 2 months prior for her iron deficiency anemia but stated she stopped for the colonoscopy procedure. No source of vaginal bleeding. Patient states she had a hysterectomy in 1997. Prior to that she used to have very heavy menstrual cycles.  Review of Systems - Oncology ROS as per HPI otherwise 12 point ROS is negative.  MEDICAL HISTORY: Past Medical History:  Diagnosis Date  . Anxiety   . Degenerative arthritis   . Depression   . GERD (gastroesophageal reflux disease)   .  Headache   . Hypertension   . Obese   . Optic neuritis   . Rheumatoid arthritis (HCC)     SURGICAL HISTORY: Past Surgical History:  Procedure Laterality Date  . ABDOMINAL HYSTERECTOMY    . arm surgery Right    knot removed  . BIOPSY  01/18/2016   Procedure: BIOPSY;  Surgeon: West Bali, MD;  [facility];  Service: Endoscopy;;  gastric  . COLONOSCOPY N/A 05/18/2014   Dr. Darrick Penna: Moderate sized external hemorrhoids, normal terminal ileum and colon. Next colonoscopy 2026  . ESOPHAGOGASTRODUODENOSCOPY N/A 01/18/2016   Procedure: ESOPHAGOGASTRODUODENOSCOPY (EGD);  Surgeon: West Bali, MD;  [facility];  Service: Endoscopy;  Laterality: N/A;  145  . ESOPHAGOGASTRODUODENOSCOPY N/A 10/23/2016   Procedure: ESOPHAGOGASTRODUODENOSCOPY (EGD);  Surgeon: West Bali, MD;  [facility];  Service: Endoscopy;  Laterality: N/A;  8:30am  . GIVENS CAPSULE STUDY N/A 10/23/2016   Procedure: GIVENS CAPSULE STUDY;  Surgeon: West Bali, MD;  [facility];  Service: Endoscopy;  Laterality: N/A;  . KNEE SURGERY Left   . SAVORY DILATION N/A 01/18/2016   Procedure: SAVORY DILATION;  Surgeon: West Bali, MD;  [facility];  Service: Endoscopy;  Laterality: N/A;    SOCIAL HISTORY: Social History   Social History  . Marital status: Married    Spouse name: N/A  . Number of children: 2  . Years of education: college   Occupational History  .  Unemployed   Social History Main Topics  . Smoking status: Never Smoker  . Smokeless tobacco: Never Used  . Alcohol use No  . Drug use: No  . Sexual activity:  Yes    Birth control/ protection: Surgical   Other Topics Concern  . Not on file   Social History Narrative  . No narrative on file    FAMILY HISTORY Family History  Problem Relation Age of Onset  . Pneumonia Father   . Diabetes Father   . Hypertension Father   . Heart attack Father   . Colon cancer Father         greater than 60  . Colon cancer Paternal Aunt        greater than 60    ALLERGIES:  has No Known Allergies.  MEDICATIONS:  Current Outpatient Prescriptions  Medication Sig Dispense Refill  . carbamazepine (TEGRETOL XR) 400 MG 12 hr tablet Take 400 mg by mouth at bedtime.     . citalopram (CELEXA) 10 MG tablet Take 10 mg by mouth daily.     Marland Kitchen doxepin (SINEQUAN) 10 MG capsule Take 10 mg by mouth at bedtime.     . furosemide (LASIX) 20 MG tablet Take 20 mg by mouth daily.     . metoprolol tartrate (LOPRESSOR) 50 MG tablet Take 50 mg by mouth 2 (two) times daily.    Marland Kitchen omeprazole (PRILOSEC) 20 MG capsule 1 po 30 mind prior to first meal (Patient taking differently: Take 20 mg by mouth daily as needed (acid reflux). 1 po 30 mind prior to first meal) 30 capsule 11  . potassium chloride (K-DUR) 10 MEQ tablet Take 10 mEq by mouth daily.    . promethazine (PHENERGAN) 25 MG tablet Take 25 mg by mouth every 6 (six) hours as needed for nausea or vomiting.    Marland Kitchen tetrahydrozoline 0.05 % ophthalmic solution Place 1 drop into both eyes 3 (three) times daily as needed (for dry eyes.).    Marland Kitchen tiZANidine (ZANAFLEX) 4 MG tablet Take 4 mg by mouth 3 (three) times daily as needed for muscle spasms.    . traMADol (ULTRAM) 50 MG tablet Take 50 mg by mouth every 8 (eight) hours as needed. For pain.     No current facility-administered medications for this visit.     PHYSICAL EXAMINATION:  Vitals:   11/23/16 0857  BP: (!) 146/79  Pulse: 67  Resp: 18  Temp: 98 F (36.7 C)  SpO2: 100%    Filed Weights   11/23/16 0857  Weight: 249 lb (112.9 kg)     Physical Exam Constitutional: Well-developed, well-nourished, and in no distress.   HENT:  Head: Normocephalic and atraumatic.  Mouth/Throat: No oropharyngeal exudate. Mucosa moist. Eyes: Pupils are equal, round, and reactive to light. Conjunctivae are normal. No scleral icterus.  Neck: Normal range of motion. Neck supple. No JVD present.  Cardiovascular:  Normal rate, regular rhythm and normal heart sounds.  Exam reveals no gallop and no friction rub.   No murmur heard. Pulmonary/Chest: Effort normal and breath sounds normal. No respiratory distress. No wheezes.No rales.  Abdominal: Soft. Bowel sounds are normal. No distension. There is no tenderness. There is no guarding.  Musculoskeletal: No edema or tenderness.  Lymphadenopathy:    No cervical or supraclavicular adenopathy.  Neurological: Alert and oriented to person, place, and time. No cranial nerve deficit.  Skin: Skin is warm and dry. No rash noted. No erythema. No pallor.  Psychiatric: Affect and judgment normal.    LABORATORY DATA: I have personally reviewed the data as listed:  No visits with results within 1 Month(s) from this visit.  Latest known visit with results is:  Admission on 10/23/2016,  Discharged on 10/23/2016  Component Date Value Ref Range Status  . WBC 10/23/2016 6.3  4.0 - 10.5 K/uL Final  . RBC 10/23/2016 4.51  3.87 - 5.11 MIL/uL Final  . Hemoglobin 10/23/2016 8.9* 12.0 - 15.0 g/dL Final  . HCT 52/77/8242 29.4* 36.0 - 46.0 % Final  . MCV 10/23/2016 65.2* 78.0 - 100.0 fL Final  . MCH 10/23/2016 19.7* 26.0 - 34.0 pg Final  . MCHC 10/23/2016 30.3  30.0 - 36.0 g/dL Final  . RDW 35/36/1443 19.5* 11.5 - 15.5 % Final  . Platelets 10/23/2016 337  150 - 400 K/uL Final   Comment: PLATELET COUNT CONFIRMED BY SMEAR  FEW GIANT PLATELETS   . Ferritin 10/23/2016 8* 11 - 307 ng/mL Final   Performed at [facility], [address], [place], [place] [address]    RADIOGRAPHIC STUDIES: I have personally reviewed the radiological images as listed and agree with the findings in the report  No results found.  ASSESSMENT/PLAN Iron deficiency anemia  Plan: -Unclear the source of her iron deficiency anemia. GI workup has been negative. -Discussed giving patient 2 doses of feraheme to build up her iron levels faster. Will plan to give her the first dose next week.  Goal is to get her ferritin >100. -Anemia workup labs today as stated below. -RTC in 2 months for follow up with repeat CBC, CMP, iron studies.  Orders Placed This Encounter  Procedures  . CBC with Differential    Standing Status:   Future    Standing Expiration Date:   11/23/2017  . Comprehensive metabolic panel    Standing Status:   Future    Standing Expiration Date:   11/23/2017  . Erythropoietin    Standing Status:   Future    Standing Expiration Date:   11/23/2017  . Iron and TIBC    Standing Status:   Future    Standing Expiration Date:   11/23/2017  . Ferritin    Standing Status:   Future    Standing Expiration Date:   11/23/2017  . Vitamin B12    Standing Status:   Future    Standing Expiration Date:   11/23/2017  . Hemoglobinopathy evaluation    Standing Status:   Future    Standing Expiration Date:   11/23/2017  . Reticulocytes    Standing Status:   Future    Standing Expiration Date:   11/23/2017    All questions were answered. The patient knows to call the clinic with any problems, questions or concerns.  This note was electronically signed.    Ralene Cork, MD  11/23/2016 8:50 AM

## 2016-11-24 LAB — HEMOGLOBINOPATHY EVALUATION
Hgb A2 Quant: 2 % (ref 1.8–3.2)
Hgb A: 98 % (ref 96.4–98.8)
Hgb C: 0 %
Hgb F Quant: 0 % (ref 0.0–2.0)
Hgb S Quant: 0 %
Hgb Variant: 0 %

## 2016-11-24 LAB — ERYTHROPOIETIN: Erythropoietin: 28.2 m[IU]/mL — ABNORMAL HIGH (ref 2.6–18.5)

## 2016-11-29 ENCOUNTER — Encounter (HOSPITAL_COMMUNITY): Payer: Self-pay

## 2016-11-29 ENCOUNTER — Encounter (HOSPITAL_BASED_OUTPATIENT_CLINIC_OR_DEPARTMENT_OTHER): Payer: Medicare Other

## 2016-11-29 VITALS — BP 145/89 | HR 66 | Temp 97.5°F | Resp 18 | Wt 250.4 lb

## 2016-11-29 DIAGNOSIS — D508 Other iron deficiency anemias: Secondary | ICD-10-CM

## 2016-11-29 DIAGNOSIS — D509 Iron deficiency anemia, unspecified: Secondary | ICD-10-CM | POA: Diagnosis not present

## 2016-11-29 MED ORDER — SODIUM CHLORIDE 0.9 % IV SOLN
510.0000 mg | Freq: Once | INTRAVENOUS | Status: AC
  Administered 2016-11-29: 510 mg via INTRAVENOUS
  Filled 2016-11-29: qty 17

## 2016-11-29 MED ORDER — SODIUM CHLORIDE 0.9 % IV SOLN
Freq: Once | INTRAVENOUS | Status: AC
Start: 2016-11-29 — End: 2016-11-29
  Administered 2016-11-29: 500 mL via INTRAVENOUS

## 2016-11-29 MED ORDER — SODIUM CHLORIDE 0.9% FLUSH
10.0000 mL | INTRAVENOUS | Status: DC | PRN
  Administered 2016-11-29: 10 mL
  Filled 2016-11-29: qty 10

## 2016-11-29 NOTE — Progress Notes (Signed)
Reviewed side effects with iron during infusion and written copy given to the patient and family for aftercare.  All questions asked and answered.  Good blood return noted with start of IV and before administration of iron infusion.    Patient complained of "stinging" at the end of the iron infusion.  Pumped stopped and good blood return noted with saline flush syringe.  Patient denied pain or discomfort with saline flush.  Saline Flush restarted with pump and no complaints voiced.  Left arm clean and dry with no swelling or tenderness noted.       Patient tolerated iron infusion with no other complaints voiced.  Good blood return noted before and after administration of iron.  Peripheral IV site clean and dry with no bruising, tenderness, or swelling noted at site.  Band aid applied.  VSS with discharge and left ambulatory with family.  No s/s of distress noted.

## 2016-11-29 NOTE — Patient Instructions (Addendum)

## 2016-12-06 ENCOUNTER — Encounter (HOSPITAL_COMMUNITY): Payer: Medicare Other | Attending: Oncology

## 2016-12-06 ENCOUNTER — Encounter (HOSPITAL_COMMUNITY): Payer: Self-pay

## 2016-12-06 VITALS — BP 121/66 | HR 66 | Temp 98.3°F | Resp 18

## 2016-12-06 DIAGNOSIS — D509 Iron deficiency anemia, unspecified: Secondary | ICD-10-CM

## 2016-12-06 DIAGNOSIS — F419 Anxiety disorder, unspecified: Secondary | ICD-10-CM | POA: Insufficient documentation

## 2016-12-06 DIAGNOSIS — H469 Unspecified optic neuritis: Secondary | ICD-10-CM | POA: Insufficient documentation

## 2016-12-06 DIAGNOSIS — I10 Essential (primary) hypertension: Secondary | ICD-10-CM | POA: Insufficient documentation

## 2016-12-06 DIAGNOSIS — E669 Obesity, unspecified: Secondary | ICD-10-CM | POA: Insufficient documentation

## 2016-12-06 DIAGNOSIS — M069 Rheumatoid arthritis, unspecified: Secondary | ICD-10-CM | POA: Insufficient documentation

## 2016-12-06 DIAGNOSIS — K219 Gastro-esophageal reflux disease without esophagitis: Secondary | ICD-10-CM | POA: Insufficient documentation

## 2016-12-06 DIAGNOSIS — Z79899 Other long term (current) drug therapy: Secondary | ICD-10-CM | POA: Insufficient documentation

## 2016-12-06 DIAGNOSIS — F329 Major depressive disorder, single episode, unspecified: Secondary | ICD-10-CM | POA: Insufficient documentation

## 2016-12-06 DIAGNOSIS — D508 Other iron deficiency anemias: Secondary | ICD-10-CM

## 2016-12-06 MED ORDER — SODIUM CHLORIDE 0.9 % IV SOLN
Freq: Once | INTRAVENOUS | Status: AC
  Administered 2016-12-06: 14:00:00 via INTRAVENOUS

## 2016-12-06 MED ORDER — SODIUM CHLORIDE 0.9 % IV SOLN
510.0000 mg | Freq: Once | INTRAVENOUS | Status: AC
  Administered 2016-12-06: 510 mg via INTRAVENOUS
  Filled 2016-12-06: qty 17

## 2016-12-06 NOTE — Progress Notes (Signed)
Tolerated infusion w/o adverse reaction.  Alert, in no distress.  VSS.  Discharged ambulatory.  

## 2016-12-07 ENCOUNTER — Other Ambulatory Visit: Payer: Self-pay

## 2016-12-07 DIAGNOSIS — D649 Anemia, unspecified: Secondary | ICD-10-CM

## 2017-01-16 ENCOUNTER — Other Ambulatory Visit (HOSPITAL_COMMUNITY): Payer: Self-pay | Admitting: *Deleted

## 2017-01-17 ENCOUNTER — Encounter (HOSPITAL_COMMUNITY): Payer: Medicare Other

## 2017-01-17 ENCOUNTER — Encounter (HOSPITAL_COMMUNITY): Payer: Self-pay

## 2017-01-17 ENCOUNTER — Other Ambulatory Visit: Payer: Self-pay

## 2017-01-17 ENCOUNTER — Encounter (HOSPITAL_COMMUNITY): Payer: Medicare Other | Attending: Oncology | Admitting: Oncology

## 2017-01-17 VITALS — BP 115/45 | HR 65 | Temp 97.9°F | Resp 20 | Ht 62.0 in | Wt 255.4 lb

## 2017-01-17 DIAGNOSIS — K219 Gastro-esophageal reflux disease without esophagitis: Secondary | ICD-10-CM | POA: Insufficient documentation

## 2017-01-17 DIAGNOSIS — R718 Other abnormality of red blood cells: Secondary | ICD-10-CM | POA: Diagnosis not present

## 2017-01-17 DIAGNOSIS — F419 Anxiety disorder, unspecified: Secondary | ICD-10-CM | POA: Diagnosis not present

## 2017-01-17 DIAGNOSIS — F329 Major depressive disorder, single episode, unspecified: Secondary | ICD-10-CM | POA: Insufficient documentation

## 2017-01-17 DIAGNOSIS — E669 Obesity, unspecified: Secondary | ICD-10-CM | POA: Insufficient documentation

## 2017-01-17 DIAGNOSIS — M069 Rheumatoid arthritis, unspecified: Secondary | ICD-10-CM | POA: Diagnosis not present

## 2017-01-17 DIAGNOSIS — D508 Other iron deficiency anemias: Secondary | ICD-10-CM

## 2017-01-17 DIAGNOSIS — H469 Unspecified optic neuritis: Secondary | ICD-10-CM | POA: Diagnosis not present

## 2017-01-17 DIAGNOSIS — I10 Essential (primary) hypertension: Secondary | ICD-10-CM | POA: Insufficient documentation

## 2017-01-17 DIAGNOSIS — Z79899 Other long term (current) drug therapy: Secondary | ICD-10-CM | POA: Diagnosis not present

## 2017-01-17 DIAGNOSIS — D509 Iron deficiency anemia, unspecified: Secondary | ICD-10-CM | POA: Diagnosis not present

## 2017-01-17 LAB — CBC WITH DIFFERENTIAL/PLATELET
Basophils Absolute: 0 10*3/uL (ref 0.0–0.1)
Basophils Relative: 0 %
Eosinophils Absolute: 0.3 10*3/uL (ref 0.0–0.7)
Eosinophils Relative: 4 %
HCT: 37.1 % (ref 36.0–46.0)
Hemoglobin: 11.6 g/dL — ABNORMAL LOW (ref 12.0–15.0)
Lymphocytes Relative: 38 %
Lymphs Abs: 2.2 10*3/uL (ref 0.7–4.0)
MCH: 22.1 pg — ABNORMAL LOW (ref 26.0–34.0)
MCHC: 31.3 g/dL (ref 30.0–36.0)
MCV: 70.7 fL — ABNORMAL LOW (ref 78.0–100.0)
Monocytes Absolute: 0.6 10*3/uL (ref 0.1–1.0)
Monocytes Relative: 10 %
Neutro Abs: 2.8 10*3/uL (ref 1.7–7.7)
Neutrophils Relative %: 48 %
Platelets: 269 10*3/uL (ref 150–400)
RBC: 5.25 MIL/uL — ABNORMAL HIGH (ref 3.87–5.11)
RDW: 22.3 % — ABNORMAL HIGH (ref 11.5–15.5)
WBC: 5.9 10*3/uL (ref 4.0–10.5)

## 2017-01-17 LAB — FERRITIN: Ferritin: 153 ng/mL (ref 11–307)

## 2017-01-17 LAB — COMPREHENSIVE METABOLIC PANEL
ALT: 15 U/L (ref 14–54)
AST: 14 U/L — ABNORMAL LOW (ref 15–41)
Albumin: 3.3 g/dL — ABNORMAL LOW (ref 3.5–5.0)
Alkaline Phosphatase: 102 U/L (ref 38–126)
Anion gap: 11 (ref 5–15)
BUN: 8 mg/dL (ref 6–20)
CO2: 24 mmol/L (ref 22–32)
Calcium: 8.6 mg/dL — ABNORMAL LOW (ref 8.9–10.3)
Chloride: 102 mmol/L (ref 101–111)
Creatinine, Ser: 0.97 mg/dL (ref 0.44–1.00)
GFR calc Af Amer: >60 mL/min (ref >60)
GFR calc non Af Amer: >60 mL/min (ref >60)
Glucose, Bld: 78 mg/dL (ref 65–99)
Potassium: 4 mmol/L (ref 3.5–5.1)
Sodium: 137 mmol/L (ref 135–145)
Total Bilirubin: 0.2 mg/dL — ABNORMAL LOW (ref 0.3–1.2)
Total Protein: 7.3 g/dL (ref 6.5–8.1)

## 2017-01-17 LAB — IRON AND TIBC
Iron: 102 ug/dL (ref 28–170)
Saturation Ratios: 43 % — ABNORMAL HIGH (ref 10.4–31.8)
TIBC: 238 ug/dL — ABNORMAL LOW (ref 250–450)
UIBC: 136 ug/dL

## 2017-01-17 NOTE — Progress Notes (Signed)
[facility] Initial Visit:  Patient Care Team: Glenda Chroman, MD as PCP - General (Internal Medicine) Danie Binder, MD as Consulting Physician (Gastroenterology)  CHIEF COMPLAINTS/PURPOSE OF CONSULTATION:  Iron deficiency anemia  HISTORY OF PRESENTING ILLNESS: Tracey Mccarthy 51 y.o. female Presents for iron deficiency anemia.  Most recent CBC from 10/23/2016 demonstrated WBC 6.3K, hemoglobin 8.9 g/dl, hematocrit 29.4%, MCV 65.2, platelets 337K.  Ferritin on 10/23/2016 was 8.  EGD on 10/23/2016 demonstrated normal esophagus, large hiatal hernia, multiple gastric polyps, mild scope trauma in duodenum and in hypopharynx during Givens capsule placement.  Givens capsule endoscopy was negative for ulcers, masses, AVMs, no source of bleeding was found. Colonoscopy on 11/20/2016 demonstrated preparation of the colon was fair in the right colon, redundant left colon, the examination was otherwise normal, external hemorrhoids, no source for iron deficiency anemia was identified.  Baseline hemoglobin from 3 months ago was 9.5g/dl, hemtocrit 29.8%, MCV 64.5.  One year ago hemoglobin was 11.3 g/dL, hematocrit 35.4%, MCV 69.5%.  Today patient states she feels fatigued.She states she has abdominal pain in her stomach. She denies any chest pain, shortness of breath, headaches. She denies any hematuria, hematochezia, melena. She has occasional nausea, vomiting, and diarrhea. She was on an oral iron supplement for 2 months prior for her iron deficiency anemia but stated she stopped for the colonoscopy procedure. No source of vaginal bleeding. Patient states she had a hysterectomy in 1997. Prior to that she used to have very heavy menstrual cycles.  INTERVAL HISTORY: Patient presents today for continued follow-up of her iron deficiency anemia.  She received 2 doses of Feraheme on 11/29/16 and 12/06/16.  Her hemoglobin has increased from 8.9 g/dL 2 months ago up to 11.6 g/dL today.  She states that  she feels fatigued still.  She denies any bleeding including hematuria, hematochezia, melena.  She states she still has problems after she eats and will want to vomit up her food after she eats.  She denies any chest pain, shortness of breath, abdominal pain, diarrhea, recent infections, focal weakness.  Review of Systems - Oncology ROS as per HPI otherwise 12 point ROS is negative.  MEDICAL HISTORY: Past Medical History:  Diagnosis Date  . Anxiety   . Degenerative arthritis   . Depression   . GERD (gastroesophageal reflux disease)   . Headache   . Hypertension   . Obese   . Optic neuritis   . Rheumatoid arthritis ([place])     SURGICAL HISTORY: Past Surgical History:  Procedure Laterality Date  . ABDOMINAL HYSTERECTOMY    . arm surgery Right    knot removed  . BIOPSY  01/18/2016   Procedure: BIOPSY;  Surgeon: Danie Binder, MD;  [facility];  Service: Endoscopy;;  gastric  . COLONOSCOPY N/A 05/18/2014   Dr. Oneida Alar: Moderate sized external hemorrhoids, normal terminal ileum and colon. Next colonoscopy 2026  . COLONOSCOPY N/A 11/20/2016   Procedure: COLONOSCOPY;  Surgeon: Danie Binder, MD;  [facility];  Service: Endoscopy;  Laterality: N/A;  12:00pm  . ESOPHAGOGASTRODUODENOSCOPY N/A 01/18/2016   Procedure: ESOPHAGOGASTRODUODENOSCOPY (EGD);  Surgeon: Danie Binder, MD;  [facility];  Service: Endoscopy;  Laterality: N/A;  145  . ESOPHAGOGASTRODUODENOSCOPY N/A 10/23/2016   Procedure: ESOPHAGOGASTRODUODENOSCOPY (EGD);  Surgeon: Danie Binder, MD;  [facility];  Service: Endoscopy;  Laterality: N/A;  8:30am  . GIVENS CAPSULE STUDY N/A 10/23/2016   Procedure: GIVENS CAPSULE STUDY;  Surgeon:  Fields, Marga Melnick, MD;  [facility];  Service: Endoscopy;  Laterality: N/A;  . KNEE SURGERY Left   . SAVORY DILATION N/A 01/18/2016   Procedure: SAVORY DILATION;  Surgeon: Danie Binder, MD;  [facility];  Service: Endoscopy;   Laterality: N/A;    SOCIAL HISTORY: Social History   Socioeconomic History  . Marital status: Married    Spouse name: Not on file  . Number of children: 2  . Years of education: college  . Highest education level: Not on file  Social Needs  . Financial resource strain: Not on file  . Food insecurity - worry: Not on file  . Food insecurity - inability: Not on file  . Transportation needs - medical: Not on file  . Transportation needs - non-medical: Not on file  Occupational History    Employer: UNEMPLOYED  Tobacco Use  . Smoking status: Never Smoker  . Smokeless tobacco: Never Used  Substance and Sexual Activity  . Alcohol use: No  . Drug use: No  . Sexual activity: Yes    Birth control/protection: Surgical  Other Topics Concern  . Not on file  Social History Narrative  . Not on file    FAMILY HISTORY Family History  Problem Relation Age of Onset  . Pneumonia Father   . Diabetes Father   . Hypertension Father   . Heart attack Father   . Colon cancer Father        greater than 91  . Colon cancer Paternal Aunt        greater than 63    ALLERGIES:  has No Known Allergies.  MEDICATIONS:  Current Outpatient Medications  Medication Sig Dispense Refill  . carbamazepine (TEGRETOL XR) 400 MG 12 hr tablet Take two (2) tablets by mouth at bedtime    . citalopram (CELEXA) 10 MG tablet Take 10 mg by mouth daily.     Marland Kitchen doxepin (SINEQUAN) 10 MG capsule Take one (1) capsule by mouth at bedtime, as needed for sleep    . ferrous gluconate (FERGON) 324 MG tablet Take 324 mg by mouth daily.  2  . furosemide (LASIX) 20 MG tablet Take 20 mg by mouth daily.     . Ibuprofen 200 MG CAPS as needed    . metoprolol tartrate (LOPRESSOR) 50 MG tablet Take 50 mg by mouth 2 (two) times daily.    Marland Kitchen omeprazole (PRILOSEC) 20 MG capsule 1 po 30 mind prior to first meal (Patient taking differently: Take 20 mg by mouth daily as needed (acid reflux). 1 po 30 mind prior to first meal) 30 capsule 11   . potassium chloride (K-DUR) 10 MEQ tablet Take 10 mEq by mouth daily.    . promethazine (PHENERGAN) 25 MG tablet Take 25 mg by mouth every 6 (six) hours as needed for nausea or vomiting.    Marland Kitchen tetrahydrozoline 0.05 % ophthalmic solution Place 1 drop into both eyes 3 (three) times daily as needed (for dry eyes.).    Marland Kitchen tiZANidine (ZANAFLEX) 4 MG tablet Take 4 mg by mouth 3 (three) times daily as needed for muscle spasms.    . traMADol (ULTRAM) 50 MG tablet Take 50 mg by mouth every 8 (eight) hours as needed. For pain.     No current facility-administered medications for this visit.     PHYSICAL EXAMINATION:  Vitals:   01/17/17 1419  BP: (!) 115/45  Pulse: 65  Resp: 20  Temp: 97.9 F (36.6 C)  SpO2: 99%  Filed Weights   01/17/17 1419  Weight: 255 lb 6.4 oz (115.8 kg)     Physical Exam Constitutional: Well-developed, well-nourished, and in no distress.   HENT:  Head: Normocephalic and atraumatic.  Mouth/Throat: No oropharyngeal exudate. Mucosa moist. Eyes: Pupils are equal, round, and reactive to light. Conjunctivae are normal. No scleral icterus.  Neck: Normal range of motion. Neck supple. No JVD present.  Cardiovascular: Normal rate, regular rhythm and normal heart sounds.  Exam reveals no gallop and no friction rub.   No murmur heard. Pulmonary/Chest: Effort normal and breath sounds normal. No respiratory distress. No wheezes.No rales.  Abdominal: Soft. Bowel sounds are normal. No distension. There is no tenderness. There is no guarding.  Musculoskeletal: No edema or tenderness.  Lymphadenopathy:    No cervical or supraclavicular adenopathy.  Neurological: Alert and oriented to person, place, and time. No cranial nerve deficit.  Skin: Skin is warm and dry. No rash noted. No erythema. No pallor.  Psychiatric: Affect and judgment normal.    LABORATORY DATA: I have personally reviewed the data as listed:  Appointment on 01/17/2017  Component Date Value Ref Range  Status  . WBC 01/17/2017 5.9  4.0 - 10.5 K/uL Final  . RBC 01/17/2017 5.25* 3.87 - 5.11 MIL/uL Final  . Hemoglobin 01/17/2017 11.6* 12.0 - 15.0 g/dL Final  . HCT 01/17/2017 37.1  36.0 - 46.0 % Final  . MCV 01/17/2017 70.7* 78.0 - 100.0 fL Final  . MCH 01/17/2017 22.1* 26.0 - 34.0 pg Final  . MCHC 01/17/2017 31.3  30.0 - 36.0 g/dL Final  . RDW 01/17/2017 22.3* 11.5 - 15.5 % Final  . Platelets 01/17/2017 269  150 - 400 K/uL Final   PLATELET COUNT CONFIRMED BY SMEAR  . Neutrophils Relative % 01/17/2017 48  % Final  . Neutro Abs 01/17/2017 2.8  1.7 - 7.7 K/uL Final  . Lymphocytes Relative 01/17/2017 38  % Final  . Lymphs Abs 01/17/2017 2.2  0.7 - 4.0 K/uL Final  . Monocytes Relative 01/17/2017 10  % Final  . Monocytes Absolute 01/17/2017 0.6  0.1 - 1.0 K/uL Final  . Eosinophils Relative 01/17/2017 4  % Final  . Eosinophils Absolute 01/17/2017 0.3  0.0 - 0.7 K/uL Final  . Basophils Relative 01/17/2017 0  % Final  . Basophils Absolute 01/17/2017 0.0  0.0 - 0.1 K/uL Final  . Sodium 01/17/2017 137  135 - 145 mmol/L Final  . Potassium 01/17/2017 4.0  3.5 - 5.1 mmol/L Final  . Chloride 01/17/2017 102  101 - 111 mmol/L Final  . CO2 01/17/2017 24  22 - 32 mmol/L Final  . Glucose, Bld 01/17/2017 78  65 - 99 mg/dL Final  . BUN 01/17/2017 8  6 - 20 mg/dL Final  . Creatinine, Ser 01/17/2017 0.97  0.44 - 1.00 mg/dL Final  . Calcium 01/17/2017 8.6* 8.9 - 10.3 mg/dL Final  . Total Protein 01/17/2017 7.3  6.5 - 8.1 g/dL Final  . Albumin 01/17/2017 3.3* 3.5 - 5.0 g/dL Final  . AST 01/17/2017 14* 15 - 41 U/L Final  . ALT 01/17/2017 15  14 - 54 U/L Final  . Alkaline Phosphatase 01/17/2017 102  38 - 126 U/L Final  . Total Bilirubin 01/17/2017 0.2* 0.3 - 1.2 mg/dL Final  . GFR calc non Af Amer 01/17/2017 >60  >60 mL/min Final  . GFR calc Af Amer 01/17/2017 >60  >60 mL/min Final   Comment: (NOTE) The eGFR has been calculated using the CKD EPI equation. This calculation  has not been validated in all  clinical situations. eGFR's persistently <60 mL/min signify possible Chronic Kidney Disease.   . Anion gap 01/17/2017 11  5 - 15 Final    RADIOGRAPHIC STUDIES: I have personally reviewed the radiological images as listed and agree with the findings in the report  No results found.  ASSESSMENT/PLAN Iron deficiency anemia Anemia workup was negative except for iron deficiency. She received 2 doses of Feraheme on 11/29/16 and 12/06/16.  Plan: -Patient had a good response with increase in her hemoglobin to 11.6 g/dL today. She still has a microcytosis, MCV 70. Iron studies are pending but I highly suspect she still has an iron deficiency given her microcytosis. Therefore will plan to follow up on her iron studies and set her up for another 2 doses of feraheme. Goal is to get her ferritin >100. -I have recommended for patient to see Dr. Oneida Alar again regarding her GI motility issues, she may benefit from reglan. -RTC in 3 months for follow up with repeat CBC, CMP, iron studies.  Orders Placed This Encounter  Procedures  . CBC with Differential    Standing Status:   Future    Standing Expiration Date:   01/17/2018  . Comprehensive metabolic panel    Standing Status:   Future    Standing Expiration Date:   01/17/2018  . Iron and TIBC    Standing Status:   Future    Standing Expiration Date:   01/17/2018  . Ferritin    Standing Status:   Future    Standing Expiration Date:   01/17/2018    All questions were answered. The patient knows to call the clinic with any problems, questions or concerns.  This note was electronically signed.    Twana First, MD  01/17/2017 2:53 PM

## 2017-03-05 ENCOUNTER — Ambulatory Visit: Payer: Medicare Other | Admitting: Gastroenterology

## 2017-04-17 ENCOUNTER — Ambulatory Visit (INDEPENDENT_AMBULATORY_CARE_PROVIDER_SITE_OTHER): Payer: Medicare Other | Admitting: Gastroenterology

## 2017-04-17 ENCOUNTER — Encounter: Payer: Self-pay | Admitting: Gastroenterology

## 2017-04-17 ENCOUNTER — Telehealth: Payer: Self-pay | Admitting: Gastroenterology

## 2017-04-17 VITALS — BP 161/96 | HR 66 | Temp 97.6°F | Ht 62.0 in | Wt 256.0 lb

## 2017-04-17 DIAGNOSIS — R1310 Dysphagia, unspecified: Secondary | ICD-10-CM | POA: Diagnosis not present

## 2017-04-17 DIAGNOSIS — K219 Gastro-esophageal reflux disease without esophagitis: Secondary | ICD-10-CM

## 2017-04-17 DIAGNOSIS — R1319 Other dysphagia: Secondary | ICD-10-CM

## 2017-04-17 MED ORDER — PANTOPRAZOLE SODIUM 40 MG PO TBEC: 40.0000 mg | DELAYED_RELEASE_TABLET | Freq: Every day | ORAL | 11 refills | Status: AC

## 2017-04-17 NOTE — Telephone Encounter (Signed)
Patient is established with hematology at [facility]. Her last labs in 12/2016 showed no need for iron infusion. I'm not sure when they planned for additional labs but her next appt is in 06/2017.   Please have her call their office and address any concerns regarding needing iron infusions.

## 2017-04-17 NOTE — Assessment & Plan Note (Signed)
Recurrent heartburn issues off of PPI. Also complains of solid food and pill dysphagia. 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 No achalasia. She had EGD for IDA 09/2016 without dilation.   Patient reports having pH probe study on July 27, 2016 however I cannot find the results. States she was never told the results.  At this point resuming PPI, pantoprazole 40 mg daily. Request copy of pH study results. Patient may require repeat upper endoscopy with esophageal dilation in the near future. Further recommendations to follow.

## 2017-04-17 NOTE — Telephone Encounter (Signed)
See OV note today. Pt is having phone problems, but she plans to call us on Friday.

## 2017-04-17 NOTE — Progress Notes (Addendum)
REVIEWED. Recent EGD NO STRICTURE AT EGJ. DYSPHAGIA LIKELY DUE TO LARGE HIATAL HERNIA. PT/S BMI > 40. RECOMMEND PT MODIFY DIET WHEN SHE IS HAVING TROUBLE WITH SOLID DYSPHAGIA. SHE CAN SEE GI [facility] AT [facility] FOR A SECOND OPINION. PT SHOULD SPEAK WITH THEM REGARDING HIATAL HERNIA REPAIR.   Primary Care Physician: Johny Shears, MD  Primary Gastroenterologist:  Jonette Eva, MD   Chief Complaint  Patient presents with  . Dysphagia    food/pills getting stuck  . Gastroesophageal Reflux    c/o vomiting    HPI: Tracey Mccarthy is a 52 y.o. female here for further evaluation of dysphagia, vomiting, Gerd. She was last seen in the office in April 2018. She is a very difficult historian as far as knowing her prior surgical history. When I last saw her she was referred to [facility] for large hiatal hernia, symptomatic,? Paraesophageal component on CT scan back in January 2018. She had multiple studies done over the year but surgery was never offered.   [facility] work up by Dr. Powel/Dr. Lanell Matar.  CT chest, abdomen, pelvis with contrast on 515 2018 showed moderate hiatal hernia with portions of the stomach surrounding the distal esophagus possibly representing postsurgical changes of prior Nissan fundoplication,, spinal stimulator leads terminated T7-8, ground glass capacity and micro modularity of the right middle lobe may be infectious or inflammatory in etiology. Upper G.I. series on Jun 20, 2016 showed small sliding type hiatal hernia, fundus of the stomach appears partially wrapped around the distal esophagus which likely represents a partial fundoplication, gastroesophageal reflux to the level of the mid esophagus noted. Esophageal manometry performed on July 12, 2016 by Dr. Jacinto Reap noted incomplete relaxation during wet swallows of the LES. Normal resting pressure of LES. Dowless transit as measured by impedance is intact. 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 EGD on July 17, 2016 by Dr. Lanell Matar showed 3 cm hiatal hernia, stricture dilated using 26mm and then 18 mm savory dilator over guidewire, evidence of prior Nissan fundoplication noted however not classic upon retroflexion.  Patient reports undergoing pH probe study but I cannot find results. I do see where she signed consent on the scheduled date (07/27/16).   In September and October of last year she underwent workup for iron deficiency anemia. Initially had an EGD in September showing large hiatal hernia, multiple small sessile polyps in the gastric fundus, esophagus appeared normal. Mild scope trauma the duodenum and the hypopharynx during givens capsule placement. Givens capsule showed no source for iron deficiency anemia. Had a colonoscopy in October showing external hemorrhoids, mildly redundant:. Plans for five year follow-up colonoscopy due to fair bowel prep in the right colon. She was referred to hematology underwent iron infusions.  Presents back today stating she's having problems swallowing again. After esophagus was dilated back in June she was feeling better for several months but symptoms have gradually returned. Sometimes the food comes back up after sticking in the chest. Vomits within 15 minutes after drinking carbonated beverages. Has difficulty swallowing hotdogs and chicken. Avoids bread altogether. After swallowing her pills she has to push it down with food. Complains of epigastric pain with meals. Bowel movements are regular. No melena or rectal bleeding. Has been out of her omeprazole for two months. She's been taking over-the-counter Alka-Seltzer type medication for her reflux with good relief.    Current Outpatient Medications  Medication Sig Dispense Refill  . carbamazepine (TEGRETOL XR) 400 MG 12  hr tablet Take two (2) tablets by mouth at bedtime    . citalopram (CELEXA) 10 MG tablet Take 10 mg by mouth daily.     Marland Kitchen doxepin (SINEQUAN) 10 MG capsule  Take one (1) capsule by mouth at bedtime, as needed for sleep    . furosemide (LASIX) 20 MG tablet Take 20 mg by mouth daily.     . Ibuprofen 200 MG CAPS as needed    . metoprolol tartrate (LOPRESSOR) 50 MG tablet Take 50 mg by mouth 2 (two) times daily.    .      . potassium chloride (K-DUR) 10 MEQ tablet Take 10 mEq by mouth daily.    . promethazine (PHENERGAN) 25 MG tablet Take 25 mg by mouth every 6 (six) hours as needed for nausea or vomiting.    Marland Kitchen tetrahydrozoline 0.05 % ophthalmic solution Place 1 drop into both eyes 3 (three) times daily as needed (for dry eyes.).    Marland Kitchen tiZANidine (ZANAFLEX) 4 MG tablet Take 4 mg by mouth 3 (three) times daily as needed for muscle spasms.    . traMADol (ULTRAM) 50 MG tablet Take 50 mg by mouth every 8 (eight) hours as needed. For pain.     No current facility-administered medications for this visit.     Allergies as of 04/17/2017  . (No Known Allergies)   Past Medical History:  Diagnosis Date  . Anxiety   . Degenerative arthritis   . Depression   . GERD (gastroesophageal reflux disease)   . Headache   . Hypertension   . Obese   . Optic neuritis   . Rheumatoid arthritis [facility])    Past Surgical History:  Procedure Laterality Date  . ABDOMINAL HYSTERECTOMY    . arm surgery Right    knot removed  . BIOPSY  01/18/2016   Procedure: BIOPSY;  Surgeon: West Bali, MD;  [facility];  Service: Endoscopy;;  gastric  . COLONOSCOPY N/A 05/18/2014   Dr. Darrick Penna: Moderate sized external hemorrhoids, normal terminal ileum and colon. Next colonoscopy 2026  . COLONOSCOPY N/A 11/20/2016   Procedure: COLONOSCOPY;  Surgeon: West Bali, MD;  [facility];  Service: Endoscopy;  Laterality: N/A;  12:00pm  . ESOPHAGOGASTRODUODENOSCOPY N/A 01/18/2016   Procedure: ESOPHAGOGASTRODUODENOSCOPY (EGD);  Surgeon: West Bali, MD;  [facility];  Service: Endoscopy;  Laterality: N/A;  145  . ESOPHAGOGASTRODUODENOSCOPY N/A  10/23/2016   Procedure: ESOPHAGOGASTRODUODENOSCOPY (EGD);  Surgeon: West Bali, MD;  [facility];  Service: Endoscopy;  Laterality: N/A;  8:30am  . GIVENS CAPSULE STUDY N/A 10/23/2016   Procedure: GIVENS CAPSULE STUDY;  Surgeon: West Bali, MD;  [facility];  Service: Endoscopy;  Laterality: N/A;  . KNEE SURGERY Left   . NISSEN FUNDOPLICATION     according to prior imaging, patient not clear on what sort of surgery procedures she had. states she had two surgeries for this  . SAVORY DILATION N/A 01/18/2016   Procedure: SAVORY DILATION;  Surgeon: West Bali, MD;  [facility];  Service: Endoscopy;  Laterality: N/A;   Family History  Problem Relation Age of Onset  . Pneumonia Father   . Diabetes Father   . Hypertension Father   . Heart attack Father   . Colon cancer Father        greater than 60  . Colon cancer Paternal Aunt        greater than 58   Social  History   Tobacco Use  . Smoking status: Never Smoker  . Smokeless tobacco: Never Used  Substance Use Topics  . Alcohol use: No  . Drug use: No    ROS:  General: Negative for anorexia, weight loss, fever, chills, fatigue, weakness. ENT: Negative for hoarseness,  nasal congestion.see hpi CV: Negative for chest pain, angina, palpitations, dyspnea on exertion, peripheral edema.  Respiratory: Negative for dyspnea at rest, dyspnea on exertion, cough, sputum, wheezing.  GI: See history of present illness. GU:  Negative for dysuria, hematuria, urinary incontinence, urinary frequency, nocturnal urination.  Endo: Negative for unusual weight change.    Physical Examination:   BP (!) 161/96   Pulse 66   Temp 97.6 F (36.4 C) (Oral)   Ht 5\' 2"  (1.575 m)   Wt 256 lb (116.1 kg)   BMI 46.82 kg/m   General: Well-nourished, well-developed in no acute distress.  Eyes: No icterus. Mouth: Oropharyngeal mucosa moist and pink , no lesions erythema or exudate. Lungs: Clear to auscultation  bilaterally.  Heart: Regular rate and rhythm, no murmurs rubs or gallops.  Abdomen: Bowel sounds are normal, nontender, nondistended, no hepatosplenomegaly or masses, no abdominal bruits or hernia , no rebound or guarding.   Extremities: No lower extremity edema. No clubbing or deformities. Neuro: Alert and oriented x 4   Skin: Warm and dry, no jaundice.   Psych: Alert and cooperative, normal mood and affect.  Labs:  Lab Results  Component Value Date   CREATININE 0.97 01/17/2017   BUN 8 01/17/2017   NA 137 01/17/2017   K 4.0 01/17/2017   CL 102 01/17/2017   CO2 24 01/17/2017   Lab Results  Component Value Date   ALT 15 01/17/2017   AST 14 (L) 01/17/2017   ALKPHOS 102 01/17/2017   BILITOT 0.2 (L) 01/17/2017   Lab Results  Component Value Date   WBC 5.9 01/17/2017   HGB 11.6 (L) 01/17/2017   HCT 37.1 01/17/2017   MCV 70.7 (L) 01/17/2017   PLT 269 01/17/2017   Lab Results  Component Value Date   IRON 102 01/17/2017   TIBC 238 (L) 01/17/2017   FERRITIN 153 01/17/2017    Imaging Studies: No results found.

## 2017-04-17 NOTE — Progress Notes (Signed)
cc'ed to pcp °

## 2017-04-17 NOTE — Telephone Encounter (Signed)
Pt was just seen in the office today by Tana Coast, PA. Forwarding the message to her.

## 2017-04-17 NOTE — Patient Instructions (Signed)
1. I have sent in a prescription for pantoprazole once daily before breakfast for acid reflux to [facility]. Let me know if you have any trouble getting it filled.  2. I will review your work up from [facility] and we will determine next step. Please call in a couple of days when you have access to a phone. Our number is [phone].

## 2017-04-17 NOTE — Telephone Encounter (Signed)
PATIENT STATED THAT WE MADE HER AN APPOINTMENT FOR AN IRON INFUSION AT [facility] BEFORE AND SHE NEEDS ANOTHER ONE.  PLEASE ADVISE

## 2017-04-19 NOTE — Telephone Encounter (Signed)
  Tried to call pt and phone not working.

## 2017-04-19 NOTE — Telephone Encounter (Signed)
PT is aware to contact Hematology for any need for iron infusions. Said she is more concerned about her abdominal pain now. She was supposed to call back and Tana Coast, PA was to review her [facility] records and tell her the plan. She does not have access to phone all of the time, but will call back this afternoon after 3:00 pm.

## 2017-04-19 NOTE — Telephone Encounter (Addendum)
Doris, please send slf and my addendum to ov note. And I don't recall her mentioning abd pain at her OV. We were mostly focused on dysphagia, vomiting. Can you get more detail about abd pain? If it is upper abd pain, it may get better now that I resumed her PPI.

## 2017-04-19 NOTE — Progress Notes (Signed)
Please let patient know that Dr. Darrick Penna recommends following up with GI at [facility] to complete their evaluation and recommendations. She needs to ask about possible hiatal hernia repair. Avoid things she has trouble swallowing, like carbonated beverages. Finely chops her meats. Continue to use food/liquid to get pills down. Dr. Darrick Penna is concerned your dysphagia is related to your hernia.   I recently restarted her PPI, she should continue that.

## 2017-04-25 NOTE — Progress Notes (Signed)
Unable to reach pt by phone. Letter mailed for her to call.

## 2017-04-25 NOTE — Telephone Encounter (Signed)
Tried to call and phone not working.  Mailing a letter to call.

## 2017-05-01 ENCOUNTER — Encounter (HOSPITAL_COMMUNITY): Payer: Self-pay | Admitting: Emergency Medicine

## 2017-05-01 ENCOUNTER — Emergency Department (HOSPITAL_COMMUNITY): Payer: Medicare Other

## 2017-05-01 ENCOUNTER — Other Ambulatory Visit: Payer: Self-pay

## 2017-05-01 ENCOUNTER — Telehealth: Payer: Self-pay | Admitting: Gastroenterology

## 2017-05-01 ENCOUNTER — Emergency Department (HOSPITAL_COMMUNITY)
Admission: EM | Admit: 2017-05-01 | Discharge: 2017-05-01 | Disposition: A | Discharge status: Home / Self Care | Payer: Medicare Other | Attending: Emergency Medicine | Admitting: Emergency Medicine

## 2017-05-01 DIAGNOSIS — I10 Essential (primary) hypertension: Secondary | ICD-10-CM | POA: Insufficient documentation

## 2017-05-01 DIAGNOSIS — R1013 Epigastric pain: Secondary | ICD-10-CM | POA: Diagnosis not present

## 2017-05-01 DIAGNOSIS — Z79899 Other long term (current) drug therapy: Secondary | ICD-10-CM | POA: Diagnosis not present

## 2017-05-01 DIAGNOSIS — R1319 Other dysphagia: Secondary | ICD-10-CM

## 2017-05-01 DIAGNOSIS — R1310 Dysphagia, unspecified: Secondary | ICD-10-CM

## 2017-05-01 LAB — CBC WITH DIFFERENTIAL/PLATELET
Basophils Absolute: 0 10*3/uL (ref 0.0–0.1)
Basophils Relative: 0 %
Eosinophils Absolute: 0.2 10*3/uL (ref 0.0–0.7)
Eosinophils Relative: 3 %
HCT: 39.1 % (ref 36.0–46.0)
Hemoglobin: 12.4 g/dL (ref 12.0–15.0)
Lymphocytes Relative: 25 %
Lymphs Abs: 1.7 10*3/uL (ref 0.7–4.0)
MCH: 22.9 pg — ABNORMAL LOW (ref 26.0–34.0)
MCHC: 31.7 g/dL (ref 30.0–36.0)
MCV: 72.1 fL — ABNORMAL LOW (ref 78.0–100.0)
Monocytes Absolute: 0.6 10*3/uL (ref 0.1–1.0)
Monocytes Relative: 9 %
Neutro Abs: 4.3 10*3/uL (ref 1.7–7.7)
Neutrophils Relative %: 63 %
Platelets: 371 10*3/uL (ref 150–400)
RBC: 5.42 MIL/uL — ABNORMAL HIGH (ref 3.87–5.11)
RDW: 15.3 % (ref 11.5–15.5)
WBC: 6.8 10*3/uL (ref 4.0–10.5)

## 2017-05-01 LAB — URINALYSIS, ROUTINE W REFLEX MICROSCOPIC
Bilirubin Urine: NEGATIVE
Glucose, UA: NEGATIVE mg/dL
Hgb urine dipstick: NEGATIVE
Ketones, ur: NEGATIVE mg/dL
Leukocytes, UA: NEGATIVE
Nitrite: NEGATIVE
Protein, ur: NEGATIVE mg/dL
Specific Gravity, Urine: 1.028 (ref 1.005–1.030)
pH: 6 (ref 5.0–8.0)

## 2017-05-01 LAB — COMPREHENSIVE METABOLIC PANEL
ALT: 21 U/L (ref 14–54)
AST: 21 U/L (ref 15–41)
Albumin: 3.6 g/dL (ref 3.5–5.0)
Alkaline Phosphatase: 124 U/L (ref 38–126)
Anion gap: 9 (ref 5–15)
BUN: 11 mg/dL (ref 6–20)
CO2: 26 mmol/L (ref 22–32)
Calcium: 9 mg/dL (ref 8.9–10.3)
Chloride: 102 mmol/L (ref 101–111)
Creatinine, Ser: 1 mg/dL (ref 0.44–1.00)
GFR calc Af Amer: >60 mL/min (ref >60)
GFR calc non Af Amer: >60 mL/min (ref >60)
Glucose, Bld: 112 mg/dL — ABNORMAL HIGH (ref 65–99)
Potassium: 3.9 mmol/L (ref 3.5–5.1)
Sodium: 137 mmol/L (ref 135–145)
Total Bilirubin: 0.4 mg/dL (ref 0.3–1.2)
Total Protein: 8.2 g/dL — ABNORMAL HIGH (ref 6.5–8.1)

## 2017-05-01 LAB — LIPASE, BLOOD: Lipase: 47 U/L (ref 11–51)

## 2017-05-01 LAB — TROPONIN I: Troponin I: <0.03 ng/mL (ref <0.03)

## 2017-05-01 MED ORDER — IOPAMIDOL (ISOVUE-300) INJECTION 61%
100.0000 mL | Freq: Once | INTRAVENOUS | Status: AC | PRN
  Administered 2017-05-01: 100 mL via INTRAVENOUS

## 2017-05-01 MED ORDER — GI COCKTAIL ~~LOC~~
30.0000 mL | Freq: Once | ORAL | Status: AC
  Administered 2017-05-01: 30 mL via ORAL
  Filled 2017-05-01: qty 30

## 2017-05-01 MED ORDER — SUCRALFATE 1 G PO TABS: 1.0000 g | ORAL_TABLET | Freq: Three times a day (TID) | ORAL | 0 refills | Status: AC

## 2017-05-01 MED ORDER — MORPHINE SULFATE (PF) 4 MG/ML IV SOLN
4.0000 mg | Freq: Once | INTRAVENOUS | Status: AC
  Administered 2017-05-01: 4 mg via INTRAVENOUS
  Filled 2017-05-01: qty 1

## 2017-05-01 MED ORDER — DICYCLOMINE HCL 20 MG PO TABS: 20.0000 mg | ORAL_TABLET | Freq: Two times a day (BID) | ORAL | 0 refills | Status: AC

## 2017-05-01 NOTE — ED Provider Notes (Addendum)
[facility] EMERGENCY DEPARTMENT Provider Note   CSN: [id number] Arrival date & time: 05/01/17  1348     History   Chief Complaint Chief Complaint  Patient presents with  . Hernia    HPI Tracey Mccarthy is a 52 y.o. female.  HPI  Patient is a 52 year old female with a history of hiatal hernia, GERD, hypertension, rheumatoid arthritis, and obesity with abdominal surgical history significant for Nissen fundoplication   presenting for epigastric pain.  Patient is reporting she believes that her "hernia".  Patient reports that the pain is been progressively worsening for the past couple months but is gotten worse in the past couple weeks.  Patient reports she called her gastroenterologist office today with a complaint of 10 out of 10 epigastric pain, and was instructed to come to the emergency department.  Patient reports that her pain is not acutely worsened in the last day, but has been progressively worsening over time.  Patient denies any nausea or vomiting associated with the pain.  Patient denies any fever chills over this interval.  Patient reports last bowel movement was today and normal for patient without diarrhea, melena or hematochezia.  Patient denies any chest pain or shortness of breath, dizziness, lightheadedness, presyncope, or radiation of the pain to arms or shoulders.  Patient denies any dysuria, urgency, or flank pain.  Patient denies vaginal discharge.  Patient reports that eating or movement will make the pain worse.  Patient denies any treatments prior to arrival.  Past Medical History:  Diagnosis Date  . Anxiety   . Degenerative arthritis   . Depression   . GERD (gastroesophageal reflux disease)   . Headache   . Hypertension   . Obese   . Optic neuritis   . Rheumatoid arthritis [facility])     Patient Active Problem List   Diagnosis Date Noted  . Esophageal dysphagia 04/17/2017  . Iron deficiency anemia   . Microcytic anemia   . Large hiatal hernia 05/16/2016    . GERD (gastroesophageal reflux disease) 01/17/2016  . Nausea with vomiting 01/17/2016  . Abdominal pain, chronic, epigastric 01/17/2016  . Special screening for malignant neoplasms, colon   . Subjective vision disturbance 12/17/2012  . 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 ABDOMINAL HYSTERECTOMY    . arm surgery Right    knot removed  . BIOPSY  01/18/2016   Procedure: BIOPSY;  Surgeon: West Bali, MD;  Location[facility];  Service: Endoscopy;;  gastric  . COLONOSCOPY N/A 05/18/2014   Dr. Darrick Penna: Moderate sized external hemorrhoids, normal terminal ileum and colon. Next colonoscopy 2026  . COLONOSCOPY N/A 11/20/2016   Procedure: COLONOSCOPY;  Surgeon: West Bali, MD;  Location: [facility];  Service: Endoscopy;  Laterality: N/A;  12:00pm  . ESOPHAGOGASTRODUODENOSCOPY N/A 01/18/2016   Procedure: ESOPHAGOGASTRODUODENOSCOPY (EGD);  Surgeon: West Bali, MD;  Location: [facility];  Service: Endoscopy;  Laterality: N/A;  145  . ESOPHAGOGASTRODUODENOSCOPY N/A 10/23/2016   Procedure: ESOPHAGOGASTRODUODENOSCOPY (EGD);  Surgeon: West Bali, MD;  Location: [facility];  Service: Endoscopy;  Laterality: N/A;  8:30am  . GIVENS CAPSULE STUDY N/A 10/23/2016   Procedure: GIVENS CAPSULE STUDY;  Surgeon: West Bali, MD;  [facility]: [facility];  Service: Endoscopy;  Laterality: N/A;  . KNEE SURGERY Left   . NISSEN FUNDOPLICATION     according to prior imaging, patient not clear on what  sort of surgery procedures she had. states she had two surgeries for this  . SAVORY DILATION N/A 01/18/2016   Procedure: SAVORY DILATION;  Surgeon: West Bali, MD;  [facility]: [facility];  Service: Endoscopy;  Laterality: N/A;     OB History   None      Home Medications    Prior to Admission medications   Medication Sig Start Date End Date Taking? Authorizing Provider  ferrous  gluconate (FERGON) 324 MG tablet Take 1 tablet by mouth daily. 12/29/16  Yes [provider]  pantoprazole (PROTONIX) 40 MG tablet Take 1 tablet (40 mg total) by mouth daily before breakfast. 04/17/17  Yes Tiffany Kocher, PA-C  promethazine (PHENERGAN) 25 MG tablet Take 25 mg by mouth every 6 (six) hours as needed for nausea or vomiting.   Yes [provider]  tetrahydrozoline 0.05 % ophthalmic solution Place 1 drop into both eyes 3 (three) times daily as needed (for dry eyes.).   Yes [provider]  tiZANidine (ZANAFLEX) 4 MG tablet Take 4 mg by mouth 3 (three) times daily as needed for muscle spasms.   Yes [provider]  traMADol (ULTRAM) 50 MG tablet Take 50 mg by mouth every 8 (eight) hours as needed. For pain. 08/01/16  Yes [provider]  carbamazepine (TEGRETOL XR) 400 MG 12 hr tablet Take two (2) tablets by mouth at bedtime 01/15/17   [provider]  citalopram (CELEXA) 10 MG tablet Take 10 mg by mouth daily.     [provider]  doxepin (SINEQUAN) 10 MG capsule Take one (1) capsule by mouth at bedtime, as needed for sleep 01/15/17   [provider]  furosemide (LASIX) 20 MG tablet Take 20 mg by mouth daily.     [provider]  metoprolol tartrate (LOPRESSOR) 50 MG tablet Take 50 mg by mouth 2 (two) times daily. 08/30/16   [provider]  omeprazole (PRILOSEC) 20 MG capsule Take 1 capsule by mouth daily.    [provider]  ondansetron (ZOFRAN) 4 MG tablet Take 1 tablet by mouth every 8 (eight) hours as needed.    [provider]  potassium chloride (K-DUR) 10 MEQ tablet Take 10 mEq by mouth daily.    [provider]    Family History Family History  Problem Relation Age of Onset  . Pneumonia Father   . Diabetes Father   . Hypertension Father   . Heart attack Father   . Colon cancer Father        greater than 60  . Colon cancer Paternal Aunt        greater than 62      Social History Social History   Tobacco Use  . Smoking status: Never Smoker  . Smokeless tobacco: Never Used  Substance Use Topics  . Alcohol use: No  . Drug use: No     Allergies   Codeine   Review of Systems Review of Systems  Constitutional: Negative for chills and fever.  HENT: Negative for congestion and rhinorrhea.   Respiratory: Negative for shortness of breath.   Cardiovascular: Negative for chest pain.  Gastrointestinal: Positive for abdominal pain. Negative for abdominal distention, blood in stool, constipation, diarrhea, nausea and vomiting.  Genitourinary: Negative for dysuria and flank pain.  Musculoskeletal: Negative for myalgias.  Neurological: Negative for dizziness and light-headedness.  All other systems reviewed and are negative.    Physical Exam Updated Vital Signs BP (!) 143/97   Pulse  73   Temp 97.7 F (36.5 C) (Oral)   Resp 15   Ht 5\' 2"  (1.575 m)   Wt 116.1 kg (256 lb)   SpO2 99%   BMI 46.82 kg/m   Physical Exam  Constitutional: She appears well-developed and well-nourished. No distress.  HENT:  Head: Normocephalic and atraumatic.  Mouth/Throat: Oropharynx is clear and moist.  Eyes: Pupils are equal, round, and reactive to light. Conjunctivae and EOM are normal.  Neck: Normal range of motion. Neck supple.  Cardiovascular: Normal rate, regular rhythm, S1 normal and S2 normal.  No murmur heard. Pulmonary/Chest: Effort normal and breath sounds normal. She has no wheezes. She has no rales.  Abdominal: Soft. She exhibits no distension. There is tenderness. There is no guarding.  Bowel sounds slightly hyperactive in the epigastrium. There is tenderness to palpation in the epigastrium and right upper quadrant without left upper quadrant tenderness.  No guarding or rebound. Patient also exhibits lower abdominal tenderness in the suprapubic region.  Musculoskeletal: Normal range of motion. She exhibits no edema or deformity.   Neurological: She is alert.  Cranial nerves grossly intact. Patient moves extremities symmetrically and with good coordination.  Skin: Skin is warm and dry. No rash noted. No erythema.  Psychiatric: She has a normal mood and affect. Her behavior is normal. Judgment and thought content normal.  Nursing note and vitals reviewed.    ED Treatments / Results  Labs (all labs ordered are listed, but only abnormal results are displayed) Labs Reviewed  COMPREHENSIVE METABOLIC PANEL - Abnormal; Notable for the following components:      Result Value   Glucose, Bld 112 (*)    Total Protein 8.2 (*)    All other components within normal limits  CBC WITH DIFFERENTIAL/PLATELET - Abnormal; Notable for the following components:   RBC 5.42 (*)    MCV 72.1 (*)    MCH 22.9 (*)    All other components within normal limits  TROPONIN I  LIPASE, BLOOD  URINALYSIS, ROUTINE W REFLEX MICROSCOPIC    EKG None  Radiology No results found.  Procedures Procedures (including critical care time)  Medications Ordered in ED Medications  morphine 4 MG/ML injection 4 mg (4 mg Intravenous Given 05/01/17 1523)     Initial Impression / Assessment and Plan / ED Course  I have reviewed the triage vital signs and the nursing notes.  Pertinent labs & imaging results that were available during my care of the patient were reviewed by me and considered in my medical decision making (see chart for details).  Clinical Course as of May 01 1636  Tue May 01, 2017  1622 Patient reassessed.  Patient having improved pain after analgesia.   [AM]    Clinical Course User Index [AM] Elisha Ponder, PA-C    Patient is nontoxic-appearing, afebrile, and in no acute distress.  Patient with acute on chronic epigastric pain.  Differential diagnosis includes pancreatitis, gastritis, worsening hiatal hernia, colitis, gastroneuritis, appendicitis, diverticulitis, ACS.  Will initiate a CMP, CBC with differential, troponin,  EKG, and CT abdomen pelvis with contrast.  Thus far, laboratory analysis unremarkable, with normal liver enzymes, normal kidney function, no evidence of infection on urinalysis, and normal WBC count.  Lipase is normal.  Troponin within normal limits.  EKG unchanged from prior no evidence of acute ischemia, infarction, or arrhythmia.  CT scan demonstrates moderate-sized hiatal hernia appearing unchanged with possible chronic inflamed lymph node in the gastrohepatic ligament versus herniation of a portion of the  liver but no inflammatory processes noted surrounding it, unchanged from prior per radiology report.  GI cocktail provided for patient and department and provide outpatient prescriptions for Carafate and Bentyl.  Patient is already receiving outpatient PPI therapy.  Patient to follow-up with her primary care provider regarding further continuation of this therapy.  Patient will need to be seen by [facility] GI at earliest possible appointment. Patient feeling better and tolerating PO at time of discharge.   Blood pressure elevated today.  Patient is on antihypertensive, metoprolol.  This is a supervised visit with Dr. Derwood Kaplan. Evaluation, management, and discharge planning discussed with this attending physician.  Final Clinical Impressions(s) / ED Diagnoses   Final diagnoses:  Epigastric pain  Elevated blood pressure reading with diagnosis of hypertension    ED Discharge Orders        Ordered    dicyclomine (BENTYL) 20 MG tablet  2 times daily     05/01/17 1712    sucralfate (CARAFATE) 1 g tablet  3 times daily with meals & bedtime     05/01/17 1712         Elisha Ponder, PA-C 05/01/17 1729    Derwood Kaplan, MD 05/01/17 2140

## 2017-05-01 NOTE — Telephone Encounter (Addendum)
Patient scheduled for 08/01/17 at 9:00am with Dr. Merri Brunette at [facility]. [address], w[address] [place]. Patient was placed on a cancellation list. They will mail patient a new pt packet  Called pt, NA and unable to leave VM

## 2017-05-01 NOTE — Telephone Encounter (Signed)
Agree with ED evaluation for 10/10 abdominal pain.

## 2017-05-01 NOTE — Telephone Encounter (Signed)
Patient called back and is aware of appt details. Records faxed to [facility]

## 2017-05-01 NOTE — ED Notes (Signed)
Pt c/o abd pain to the mid epigastric area. PT stating she has a hx of an abd hernia that she was evaluated for in the past. Pt stating she has noticed increased size of the area. Pain is worse and reproducible on palpation. PT denies chest pain, numbness, tingling, or pain radiating to the right arm, jaw, or shoulder area.

## 2017-05-01 NOTE — ED Triage Notes (Signed)
Pt has hernia, states pain became worse yesterday, denies , vomiting or diarrhea

## 2017-05-01 NOTE — Telephone Encounter (Signed)
See previous note

## 2017-05-01 NOTE — ED Notes (Signed)
Patient is back from CT at this time.  

## 2017-05-01 NOTE — Telephone Encounter (Signed)
See previous notes. I was unable to reach her and had to wait for a return call. She said she is having abdominal pain ( mid upper gastric) and she rates it at a 10 now. She has nausea, no vomiting. She is taking the Protonix bid because of the pain she has been having. She is aware of Dr. Darrick Penna recommendations to go to GI at [facility] to complete her evaluation.  I told her to go to ED now with pain rated at a 10. She is going to try to get a ride to [facility] but if she can't she will go to [facility].   Forwarding to [facility] Clinical to make the referral.

## 2017-05-01 NOTE — Discharge Instructions (Addendum)
Please see the information and instructions below regarding your visit.  Your diagnoses today include:  1. Epigastric pain   2. Elevated blood pressure reading with diagnosis of hypertension    We can perform an ultrasound tomorrow at 8am  Your workup and examination is reassuring today.  There are no acute abnormalities today.  Most likely compressing the esophagus and causing burning of stomach acid up into the esophagus.  Abdominal (belly) pain can be caused by many things. Your caregiver performed an examination and possibly ordered blood/urine tests and imaging (CT scan, x-rays, ultrasound). Many cases can be observed and treated at home after initial evaluation in the emergency department. Even though you are being discharged home, abdominal pain can be unpredictable. Therefore, you need a repeated exam if your pain does not resolve, returns, or worsens. Most patients with abdominal pain don't have to be admitted to the hospital or have surgery, but serious problems like appendicitis and gallbladder attacks can start out as nonspecific pain. Many abdominal conditions cannot be diagnosed in one visit, so follow-up evaluations are very important.  Tests performed today include: Blood counts and electrolytes Blood tests to check liver and kidney function Blood tests to check pancreas function Urine test to look for infection and pregnancy (in women) Vital signs. See below for your results today.   See side panel of your discharge paperwork for testing performed today. Vital signs are listed at the bottom of these instructions.   Medications prescribed:    Take any prescribed medications only as prescribed, and any over the counter medications only as directed on the packaging.  The medication Bentyl is used to prevent spasming of the colon.  Medication Carafate is used to coat the lining of the stomach and prevent irritation.  Home care instructions:  Try eating, but start with foods  that have a lot of fluid in them. Good examples are soup, Jell-O, and popsicles. If you do OK with those foods, you can try soft, bland foods, such as plain yogurt. Foods that are high in carbohydrates ("carbs"), like bread or saltine crackers, can help settle your stomach. Some people also find that ginger helps with nausea. You should avoid foods that have a lot of fat in them. They can make nausea worse. Call your doctor if your symptoms come back when you try to eat.  Please follow any educational materials contained in this packet.   Follow-up instructions: Please follow-up with your primary care provider in 7 days for further evaluation of your symptoms as well as assessment if the medications we prescribed today should be continued.   Please follow up with [facility] gastroenterology per your previously scheduled appointment.  Return instructions:  Please return to the Emergency Department if you experience worsening symptoms.  SEEK IMMEDIATE MEDICAL ATTENTION IF: The pain does not go away or becomes severe  A temperature above 101F develops  Repeated vomiting occurs (multiple episodes)  The pain becomes localized to portions of the abdomen. The right side could possibly be appendicitis. In an adult, the left lower portion of the abdomen could be colitis or diverticulitis.  Blood is being passed in stools or vomit (bright red or black tarry stools)  You develop chest pain, difficulty breathing, dizziness or fainting, or become confused, poorly responsive, or inconsolable (young children) If you have any other emergent concerns regarding your health  Additional Information:   Your vital signs today were: BP 129/78    Pulse 66    Temp 97.7 F (  36.5 C) (Oral)    Resp 18    Ht 5\' 2"  (1.575 m)    Wt 116.1 kg (256 lb)    SpO2 96%    BMI 46.82 kg/m  If your blood pressure (BP) was elevated on multiple readings during this visit above 130 for the top number or above 80 for the bottom number,  please have this repeated by your primary care provider within one month. --------------  Thank you for allowing to participate in your care today.

## 2017-05-01 NOTE — Telephone Encounter (Signed)
PATIENT CALLED AND STATED SHE WAS GOING TO [place], SHE SAID SHE WAS SUPPOSED TO CALL HERE AND LET us KNOW

## 2017-05-01 NOTE — Telephone Encounter (Signed)
Pt was seen recently on 3/19 and called today saying she isn't feeling any better. Please call her at [phone]

## 2017-05-02 NOTE — Telephone Encounter (Signed)
Reviewed ed records. No acute findings.   Tracey Mccarthy, patient has had procedures with GI at [facility]. Are they classifying her as a new patient? Maybe sooner appt if established patient?

## 2017-05-03 NOTE — Telephone Encounter (Signed)
Called [facility] and was advised she is in as an established patient, they just mail packets out to all patients. They do have patient on the wait list for sooner appointment if anyone cancels.

## 2017-07-11 ENCOUNTER — Telehealth: Payer: Self-pay | Admitting: Gastroenterology

## 2017-07-11 NOTE — Telephone Encounter (Signed)
Pt said she is not having any problems at this time. She just wants to know when she is suppose to come in for follow up.  She is aware Tana Coast, PA is on vacation and we will let her know some time next week.

## 2017-07-11 NOTE — Telephone Encounter (Signed)
Patient called inquiring about when she should come back in for a follow up.  I did not read anything about a date on her last office visit notes

## 2017-07-16 ENCOUNTER — Encounter: Payer: Self-pay | Admitting: Gastroenterology

## 2017-07-16 NOTE — Telephone Encounter (Signed)
Noted, let with f/u has been mailed.

## 2017-07-16 NOTE — Telephone Encounter (Signed)
PATIENT SCHEDULED AND LETTER SENT  °

## 2017-07-16 NOTE — Telephone Encounter (Signed)
Tried calling pt, mailbox is full. Pt will need a 3 month appt with SF.

## 2017-07-16 NOTE — Telephone Encounter (Addendum)
She has appt on 08/01/17 with Dr. Merri Brunette at [facility]. Unless something has changed that I'm not aware of.   She should keep her [facility] appt first. And then plan to see [facility] only  in 3 months.

## 2017-07-19 ENCOUNTER — Ambulatory Visit (HOSPITAL_COMMUNITY): Payer: Medicare Other | Admitting: Internal Medicine

## 2017-08-09 ENCOUNTER — Inpatient Hospital Stay (HOSPITAL_COMMUNITY): Payer: Medicare Other | Attending: Hematology | Admitting: Internal Medicine

## 2017-08-09 ENCOUNTER — Encounter (HOSPITAL_COMMUNITY): Payer: Self-pay | Admitting: Internal Medicine

## 2017-08-09 ENCOUNTER — Inpatient Hospital Stay (HOSPITAL_COMMUNITY): Payer: Medicare Other

## 2017-08-09 VITALS — BP 124/70 | HR 85 | Temp 97.8°F | Resp 18 | Wt 261.2 lb

## 2017-08-09 DIAGNOSIS — R1310 Dysphagia, unspecified: Secondary | ICD-10-CM | POA: Diagnosis not present

## 2017-08-09 DIAGNOSIS — D508 Other iron deficiency anemias: Secondary | ICD-10-CM

## 2017-08-09 DIAGNOSIS — K317 Polyp of stomach and duodenum: Secondary | ICD-10-CM | POA: Insufficient documentation

## 2017-08-09 DIAGNOSIS — Z86010 Personal history of colonic polyps: Secondary | ICD-10-CM | POA: Diagnosis not present

## 2017-08-09 DIAGNOSIS — K449 Diaphragmatic hernia without obstruction or gangrene: Secondary | ICD-10-CM | POA: Insufficient documentation

## 2017-08-09 DIAGNOSIS — K219 Gastro-esophageal reflux disease without esophagitis: Secondary | ICD-10-CM | POA: Diagnosis not present

## 2017-08-09 DIAGNOSIS — E669 Obesity, unspecified: Secondary | ICD-10-CM | POA: Diagnosis not present

## 2017-08-09 DIAGNOSIS — D509 Iron deficiency anemia, unspecified: Secondary | ICD-10-CM | POA: Insufficient documentation

## 2017-08-09 DIAGNOSIS — R109 Unspecified abdominal pain: Secondary | ICD-10-CM | POA: Insufficient documentation

## 2017-08-09 DIAGNOSIS — M069 Rheumatoid arthritis, unspecified: Secondary | ICD-10-CM | POA: Insufficient documentation

## 2017-08-09 DIAGNOSIS — Z79899 Other long term (current) drug therapy: Secondary | ICD-10-CM | POA: Insufficient documentation

## 2017-08-09 DIAGNOSIS — I10 Essential (primary) hypertension: Secondary | ICD-10-CM | POA: Insufficient documentation

## 2017-08-09 DIAGNOSIS — F418 Other specified anxiety disorders: Secondary | ICD-10-CM | POA: Diagnosis not present

## 2017-08-09 DIAGNOSIS — R112 Nausea with vomiting, unspecified: Secondary | ICD-10-CM | POA: Insufficient documentation

## 2017-08-09 DIAGNOSIS — Z800 Family history of malignant neoplasm of digestive organs: Secondary | ICD-10-CM | POA: Diagnosis not present

## 2017-08-09 LAB — CBC WITH DIFFERENTIAL/PLATELET
Basophils Absolute: 0 10*3/uL (ref 0.0–0.1)
Basophils Relative: 0 %
Eosinophils Absolute: 0.3 10*3/uL (ref 0.0–0.7)
Eosinophils Relative: 4 %
HCT: 33.5 % — ABNORMAL LOW (ref 36.0–46.0)
Hemoglobin: 10.7 g/dL — ABNORMAL LOW (ref 12.0–15.0)
Lymphocytes Relative: 21 %
Lymphs Abs: 1.6 10*3/uL (ref 0.7–4.0)
MCH: 23.1 pg — ABNORMAL LOW (ref 26.0–34.0)
MCHC: 31.9 g/dL (ref 30.0–36.0)
MCV: 72.4 fL — ABNORMAL LOW (ref 78.0–100.0)
Monocytes Absolute: 0.5 10*3/uL (ref 0.1–1.0)
Monocytes Relative: 7 %
Neutro Abs: 5.1 10*3/uL (ref 1.7–7.7)
Neutrophils Relative %: 68 %
Platelets: 330 10*3/uL (ref 150–400)
RBC: 4.63 MIL/uL (ref 3.87–5.11)
RDW: 15 % (ref 11.5–15.5)
WBC: 7.5 10*3/uL (ref 4.0–10.5)

## 2017-08-09 LAB — FERRITIN: Ferritin: 144 ng/mL (ref 11–307)

## 2017-08-09 LAB — COMPREHENSIVE METABOLIC PANEL
ALT: 22 U/L (ref 0–44)
AST: 24 U/L (ref 15–41)
Albumin: 3.6 g/dL (ref 3.5–5.0)
Alkaline Phosphatase: 102 U/L (ref 38–126)
Anion gap: 9 (ref 5–15)
BUN: 17 mg/dL (ref 6–20)
CO2: 28 mmol/L (ref 22–32)
Calcium: 9.1 mg/dL (ref 8.9–10.3)
Chloride: 101 mmol/L (ref 98–111)
Creatinine, Ser: 0.98 mg/dL (ref 0.44–1.00)
GFR calc Af Amer: >60 mL/min (ref >60)
GFR calc non Af Amer: >60 mL/min (ref >60)
Glucose, Bld: 182 mg/dL — ABNORMAL HIGH (ref 70–99)
Potassium: 3.4 mmol/L — ABNORMAL LOW (ref 3.5–5.1)
Sodium: 138 mmol/L (ref 135–145)
Total Bilirubin: 0.4 mg/dL (ref 0.3–1.2)
Total Protein: 7.8 g/dL (ref 6.5–8.1)

## 2017-08-09 LAB — LACTATE DEHYDROGENASE: LDH: 123 U/L (ref 98–192)

## 2017-08-09 NOTE — Patient Instructions (Signed)
[facility] Discharge Instructions  You saw Dr. Higgs today.   Thank you for choosing [facility] at [facility] to provide your oncology and hematology care.  To afford each patient quality time with our provider, please arrive at least 15 minutes before your scheduled appointment time.   If you have a lab appointment with the Cancer Center please come in thru the  Main Entrance and check in at the main information desk  You need to re-schedule your appointment should you arrive 10 or more minutes late.  We strive to give you quality time with our providers, and arriving late affects you and other patients whose appointments are after yours.  Also, if you no show three or more times for appointments you may be dismissed from the clinic at the providers discretion.     Again, thank you for choosing [facility].  Our hope is that these requests will decrease the amount of time that you wait before being seen by our physicians.       _____________________________________________________________  Should you have questions after your visit to [facility], please contact our office at [phone] between the hours of 8:30 a.m. and 4:30 p.m.  Voicemails left after 4:30 p.m. will not be returned until the following business day.  For prescription refill requests, have your pharmacy contact our office.       Resources For Cancer Patients and their Caregivers ? American Cancer Society: Can assist with transportation, wigs, general needs, runs Look Good Feel Better.        1-888-227-6333 ? Cancer Care: Provides financial assistance, online support groups, medication/co-pay assistance.  1-800-813-HOPE (4673) ? [facility] Assists [place] cancer patients and their families through emotional , educational and financial support.  336-427-4357 ? [place] DSS Where to apply for food  stamps, Medicaid and utility assistance. 336-342-1394 ? [facility]: Transportation to medical appointments. 336-347-2287 ? Social Security Administration: May apply for disability if have a Stage IV cancer. 336-342-7796 1-800-772-1213 ? [place] Aging, Disability and Transit Services: Assists with nutrition, care and transit needs. 336-349-2343  Cancer Center Support Programs:   > Cancer Support Group  2nd Tuesday of the month 1pm-2pm, Journey Room   > Creative Journey  3rd Tuesday of the month 1130am-1pm, Journey Room     

## 2017-08-09 NOTE — Progress Notes (Signed)
Diagnosis Other iron deficiency anemia - Plan: CBC with Differential/Platelet, Comprehensive metabolic panel, Lactate dehydrogenase, Ferritin, CBC with Differential/Platelet, Comprehensive metabolic panel, Lactate dehydrogenase, Ferritin  Staging Cancer Staging No matching staging information was found for the patient.  Assessment and Plan:  1.  Iron deficiency anemia.  Pt had labs done today 08/09/2017 which was reviewed with pt and showed WBC 7.5 HB 10.7 plts 330,000.  Ferritin is pending.  Chemistries WNL.  Pt will be notified of Ferritin results and if levels are stable she will have repeat labs in 01/2018.  HB electrophoresis done previously was negative.    2.  Abdominal pain.  Chart reviewed shows pt had CT of abdomen and pelvis done 05/01/2017 that was reviewed and showed  IMPRESSION: 1. No acute findings or clear explanation for the patient's symptoms. 2. Moderate-size hiatal hernia appears unchanged. There is a possible chronic inflamed lymph node in the gastrohepatic ligament versus herniation of a portion of the liver (papillary process of the caudate lobe) into the hernia; overall appearance is unchanged from previous study.  She should continue to follow-up with GI if ongoing symptoms.  This has been described as chronic on previous evaluations.    3.  HTN.  BP is 124/70.  Follow-up with PCP.    4.  Dysphagia.  Pt reports difficulty swallowing solids.  She is referred back to GI for evaluation.     Interval History:  Historical data obtained from note dated 01/17/2017: 52 yr old female previously followed by Dr. Talbert Cage for IDA.  She was last treated with IV iron on 12/06/2016.  Pt had   EGD on 10/23/2016 demonstrated normal esophagus, large hiatal hernia, multiple gastric polyps, mild scope trauma in duodenum and in hypopharynx during Givens capsule placement.  Givens capsule endoscopy was negative for ulcers, masses, AVMs, no source of bleeding was found. Colonoscopy on 11/20/2016  demonstrated preparation of the colon was fair in the right colon, redundant left colon, the examination was otherwise normal, external hemorrhoids, no source for iron deficiency anemia was identified.  Current Status:  Pt is seen today for follow-up.  She has not had labs done prior to visit.  She reports some pain in stomach.  She also reports some problems swallowing solids.    Problem List Patient Active Problem List   Diagnosis Date Noted  . Esophageal dysphagia [R13.10] 04/17/2017  . Iron deficiency anemia [D50.9]   . Microcytic anemia [D50.9]   . Large hiatal hernia [K44.9] 05/16/2016  . GERD (gastroesophageal reflux disease) [K21.9] 01/17/2016  . Nausea with vomiting [R11.2] 01/17/2016  . Abdominal pain, chronic, epigastric [R10.13, G89.29] 01/17/2016  . Special screening for malignant neoplasms, colon [Z12.11]   . 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 Anxiety   . Degenerative arthritis   . Depression   . GERD (gastroesophageal reflux disease)   . Headache   . Hypertension   . Obese   . Optic neuritis   . Rheumatoid arthritis [facility])     Past Surgical History Past Surgical History:  Procedure Laterality Date  . ABDOMINAL HYSTERECTOMY    . arm surgery Right    knot removed  . BIOPSY  01/18/2016   Procedure: BIOPSY;  Surgeon: Danie Binder, MD;  [facility];  Service: Endoscopy;;  gastric  . COLONOSCOPY N/A 05/18/2014   Dr. Oneida Alar:  Moderate sized external hemorrhoids, normal terminal ileum and colon. Next colonoscopy 2026  . COLONOSCOPY N/A 11/20/2016   Procedure: COLONOSCOPY;  Surgeon: Danie Binder, MD;  [facility];  Service: Endoscopy;  Laterality: N/A;  12:00pm  . ESOPHAGOGASTRODUODENOSCOPY N/A 01/18/2016   Procedure: ESOPHAGOGASTRODUODENOSCOPY (EGD);  Surgeon:  Danie Binder, MD;  [facility];  Service: Endoscopy;  Laterality: N/A;  145  . ESOPHAGOGASTRODUODENOSCOPY N/A 10/23/2016   Procedure: ESOPHAGOGASTRODUODENOSCOPY (EGD);  Surgeon: Danie Binder, MD;  [facility];  Service: Endoscopy;  Laterality: N/A;  8:30am  . GIVENS CAPSULE STUDY N/A 10/23/2016   Procedure: GIVENS CAPSULE STUDY;  Surgeon: Danie Binder, MD;  [facility];  Service: Endoscopy;  Laterality: N/A;  . KNEE SURGERY Left   . NISSEN FUNDOPLICATION     according to prior imaging, patient not clear on what sort of surgery procedures she had. states she had two surgeries for this  . SAVORY DILATION N/A 01/18/2016   Procedure: SAVORY DILATION;  Surgeon: Danie Binder, MD;  [facility];  Service: Endoscopy;  Laterality: N/A;    Family History Family History  Problem Relation Age of Onset  . Pneumonia Father   . Diabetes Father   . Hypertension Father   . Heart attack Father   . Colon cancer Father        greater than 99  . Colon cancer Paternal Aunt        greater than 74     Social History  reports that she has never smoked. She has never used smokeless tobacco. She reports that she does not drink alcohol or use drugs.  Medications  Current Outpatient Medications:  .  carbamazepine (TEGRETOL XR) 400 MG 12 hr tablet, Take two (2) tablets by mouth at bedtime, Disp: , Rfl:  .  citalopram (CELEXA) 10 MG tablet, Take 10 mg by mouth daily. , Disp: , Rfl:  .  dicyclomine (BENTYL) 20 MG tablet, Take 1 tablet (20 mg total) by mouth 2 (two) times daily., Disp: 20 tablet, Rfl: 0 .  doxepin (SINEQUAN) 10 MG capsule, Take one (1) capsule by mouth at bedtime, as needed for sleep, Disp: , Rfl:  .  ferrous gluconate (FERGON) 324 MG tablet, Take 1 tablet by mouth daily., Disp: , Rfl:  .  furosemide (LASIX) 20 MG tablet, Take 20 mg by mouth daily. , Disp: , Rfl:  .  metoprolol tartrate (LOPRESSOR) 50 MG tablet, Take 50 mg by mouth 2 (two)  times daily., Disp: , Rfl:  .  omeprazole (PRILOSEC) 20 MG capsule, Take 1 capsule by mouth daily., Disp: , Rfl:  .  ondansetron (ZOFRAN) 4 MG tablet, Take 1 tablet by mouth every 8 (eight) hours as needed., Disp: , Rfl:  .  pantoprazole (PROTONIX) 40 MG tablet, Take 1 tablet (40 mg total) by mouth daily before breakfast., Disp: 30 tablet, Rfl: 11 .  potassium chloride (K-DUR) 10 MEQ tablet, Take 10 mEq by mouth daily., Disp: , Rfl:  .  promethazine (PHENERGAN) 25 MG tablet, Take 25 mg by mouth every 6 (six) hours as needed for nausea or vomiting., Disp: , Rfl:  .  tetrahydrozoline 0.05 % ophthalmic solution, Place 1 drop into both eyes 3 (three) times daily as needed (for dry eyes.)., Disp: , Rfl:  .  tiZANidine (ZANAFLEX) 4 MG tablet, Take 4 mg by mouth 3 (three) times daily as needed for muscle spasms., Disp: , Rfl:  .  traMADol (  ULTRAM) 50 MG tablet, Take 50 mg by mouth every 8 (eight) hours as needed. For pain., Disp: , Rfl:  .  sucralfate (CARAFATE) 1 g tablet, Take 1 tablet (1 g total) by mouth 4 (four) times daily -  with meals and at bedtime for 7 days., Disp: 28 tablet, Rfl: 0  Allergies Codeine  Review of Systems Review of Systems - Oncology ROS negative other than abdominal pain and trouble swallowing liquids.     Physical Exam  Vitals Wt Readings from Last 3 Encounters:  08/09/17 261 lb 3.2 oz (118.5 kg)  05/01/17 256 lb (116.1 kg)  04/17/17 256 lb (116.1 kg)   Temp Readings from Last 3 Encounters:  08/09/17 97.8 F (36.6 C) (Oral)  05/01/17 98 F (36.7 C)  04/17/17 97.6 F (36.4 C) (Oral)   BP Readings from Last 3 Encounters:  08/09/17 124/70  05/01/17 138/78  04/17/17 (!) 161/96   Pulse Readings from Last 3 Encounters:  08/09/17 85  05/01/17 77  04/17/17 66    Constitutional: Well-developed, well-nourished, and in no distress.   HENT: Head: Normocephalic and atraumatic.  Mouth/Throat: No oropharyngeal exudate. Mucosa moist. Eyes: Pupils are equal,  round, and reactive to light. Conjunctivae are normal. No scleral icterus.  Neck: Normal range of motion. Neck supple. No JVD present.  Cardiovascular: Normal rate, regular rhythm and normal heart sounds.  Exam reveals no gallop and no friction rub.   No murmur heard. Pulmonary/Chest: Effort normal and breath sounds normal. No respiratory distress. No wheezes.No rales.  Abdominal: Soft. Bowel sounds are normal. Hernia noted.  Pt reports some tenderness to palpation in epigastric region.  No guarding noted.   Musculoskeletal: No edema or tenderness.  Lymphadenopathy: No cervical, axillary or supraclavicular adenopathy.  Neurological: Alert and oriented to person, place, and time. No cranial nerve deficit.  Skin: Skin is warm and dry. No rash noted. No erythema. No pallor.  Psychiatric: Affect and judgment normal.   Labs Appointment on 08/09/2017  Component Date Value Ref Range Status  . WBC 08/09/2017 7.5  4.0 - 10.5 K/uL Final  . RBC 08/09/2017 4.63  3.87 - 5.11 MIL/uL Final  . Hemoglobin 08/09/2017 10.7* 12.0 - 15.0 g/dL Final  . HCT 08/09/2017 33.5* 36.0 - 46.0 % Final  . MCV 08/09/2017 72.4* 78.0 - 100.0 fL Final  . MCH 08/09/2017 23.1* 26.0 - 34.0 pg Final  . MCHC 08/09/2017 31.9  30.0 - 36.0 g/dL Final  . RDW 08/09/2017 15.0  11.5 - 15.5 % Final  . Platelets 08/09/2017 330  150 - 400 K/uL Final  . Neutrophils Relative % 08/09/2017 68  % Final  . Neutro Abs 08/09/2017 5.1  1.7 - 7.7 K/uL Final  . Lymphocytes Relative 08/09/2017 21  % Final  . Lymphs Abs 08/09/2017 1.6  0.7 - 4.0 K/uL Final  . Monocytes Relative 08/09/2017 7  % Final  . Monocytes Absolute 08/09/2017 0.5  0.1 - 1.0 K/uL Final  . Eosinophils Relative 08/09/2017 4  % Final  . Eosinophils Absolute 08/09/2017 0.3  0.0 - 0.7 K/uL Final  . Basophils Relative 08/09/2017 0  % Final  . Basophils Absolute 08/09/2017 0.0  0.0 - 0.1 K/uL Final   Performed at [facility], [address], [place], [place] [address]  . Sodium  08/09/2017 138  135 - 145 mmol/L Final  . Potassium 08/09/2017 3.4* 3.5 - 5.1 mmol/L Final  . Chloride 08/09/2017 101  98 - 111 mmol/L Final   Please note change in  reference range.  . CO2 08/09/2017 28  22 - 32 mmol/L Final  . Glucose, Bld 08/09/2017 182* 70 - 99 mg/dL Final   Please note change in reference range.  . BUN 08/09/2017 17  6 - 20 mg/dL Final   Please note change in reference range.  . Creatinine, Ser 08/09/2017 0.98  0.44 - 1.00 mg/dL Final  . Calcium 08/09/2017 9.1  8.9 - 10.3 mg/dL Final  . Total Protein 08/09/2017 7.8  6.5 - 8.1 g/dL Final  . Albumin 08/09/2017 3.6  3.5 - 5.0 g/dL Final  . AST 08/09/2017 24  15 - 41 U/L Final  . ALT 08/09/2017 22  0 - 44 U/L Final   Please note change in reference range.  . Alkaline Phosphatase 08/09/2017 102  38 - 126 U/L Final  . Total Bilirubin 08/09/2017 0.4  0.3 - 1.2 mg/dL Final  . GFR calc non Af Amer 08/09/2017 >60  >60 mL/min Final  . GFR calc Af Amer 08/09/2017 >60  >60 mL/min Final   Comment: (NOTE) The eGFR has been calculated using the CKD EPI equation. This calculation has not been validated in all clinical situations. eGFR's persistently <60 mL/min signify possible Chronic Kidney Disease.   Georgiann Hahn gap 08/09/2017 9  5 - 15 Final   Performed at [facility], [address], [place], [place] [address]  . LDH 08/09/2017 123  98 - 192 U/L Final   Performed at [facility], [address], [place], [place] [address]     Pathology Orders Placed This Encounter  Procedures  . CBC with Differential/Platelet    Standing Status:   Future    Number of Occurrences:   1    Standing Expiration Date:   08/10/2018  . Comprehensive metabolic panel    Standing Status:   Future    Number of Occurrences:   1    Standing Expiration Date:   08/10/2018  . Lactate dehydrogenase    Standing Status:   Future    Number of Occurrences:   1    Standing Expiration Date:   08/10/2018  . Ferritin    Standing Status:   Future    Number of  Occurrences:   1    Standing Expiration Date:   08/10/2018  . CBC with Differential/Platelet    Standing Status:   Future    Standing Expiration Date:   08/10/2018  . Comprehensive metabolic panel    Standing Status:   Future    Standing Expiration Date:   08/10/2018  . Lactate dehydrogenase    Standing Status:   Future    Standing Expiration Date:   08/10/2018  . Ferritin    Standing Status:   Future    Standing Expiration Date:   08/10/2018       Zoila Shutter MD

## 2017-08-16 ENCOUNTER — Telehealth (HOSPITAL_COMMUNITY): Payer: Self-pay

## 2017-08-16 NOTE — Telephone Encounter (Signed)
Notified patient that labs drawn last week were good per Dr. Melton Alar. Patient verbalized understanding.

## 2017-10-17 ENCOUNTER — Ambulatory Visit: Payer: Medicare Other | Admitting: Gastroenterology

## 2017-11-07 ENCOUNTER — Other Ambulatory Visit: Payer: Self-pay | Admitting: *Deleted

## 2017-11-07 ENCOUNTER — Ambulatory Visit (INDEPENDENT_AMBULATORY_CARE_PROVIDER_SITE_OTHER): Payer: Medicare HMO | Admitting: Gastroenterology

## 2017-11-07 ENCOUNTER — Encounter: Payer: Self-pay | Admitting: Gastroenterology

## 2017-11-07 DIAGNOSIS — R1030 Lower abdominal pain, unspecified: Secondary | ICD-10-CM

## 2017-11-07 DIAGNOSIS — R1310 Dysphagia, unspecified: Secondary | ICD-10-CM

## 2017-11-07 DIAGNOSIS — R1319 Other dysphagia: Secondary | ICD-10-CM

## 2017-11-07 NOTE — Progress Notes (Signed)
Subjective:    Patient ID: Tracey Mccarthy, female    DOB: March 08, 1965, 52 y.o.   MRN: [id number] Johny Shears, MD  HPI Has pain in lower abdomen, dull, but needs to ball up to go to sleep. JUST LIKE HAVING CRAMPS. HAD A HYSTERECTOMY: NO CANCER. WHEN SHE EATS HER FOOD DOESN'T GO DOWN AND IT SITS DOWN AND SHE HAS THROWS IT BACK UP. COUPLE TIMES A WEEK. NOT LOSING ANY WEIGHT. WALKS WITH A CANE. STAYS RIGHT IN THE FRONT. NO PROBLEMS WITH HER BOWELS. BETTER: NOTHING. TRIED HEATING PAD: MADE IT WORSE. ICE PACK NO BETTER. HEARTBURN: 2X/WEEK. TUMS: NO RELIEF. MEATS GET STUCK/PILLS AND HAS TO EAT BEHIND PILLS TO MAKE THEM GO DOWN. SAW HEMATOLOGY FOR ANEMIA. 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 HAD L KNEE AND IT DID GOOD. AND ON RIGHT KNEE BUT OT'S NOT BETTER. BMs: EVERY DAY. MAY HAVE LEFT SIDED CHEST PAIN: CANE USUALLY  WEIGHT STABLE FROM NOV 1025([phone] LBS)  PT DENIES FEVER, CHILLS, HEMATOCHEZIA, HEMATEMESIS, nausea, vomiting, melena, diarrhea, CHEST PAIN, SHORTNESS OF BREATH,  CHANGE IN BOWEL IN HABITS, constipation, problems with sedation, OR heartburn or indigestion.  Past Medical History:  Diagnosis Date  . Anxiety   . Degenerative arthritis   . Depression   . GERD (gastroesophageal reflux disease)   . Headache   . Hypertension   . Obese   . Optic neuritis   . Rheumatoid arthritis [facility])    Past Surgical History:  Procedure Laterality Date  . ABDOMINAL HYSTERECTOMY    . arm surgery Right    knot removed  . BIOPSY  01/18/2016   Procedure: BIOPSY;  Surgeon: West Bali, MD;  [facility];  Service: Endoscopy;;  gastric  . COLONOSCOPY N/A 05/18/2014   Dr. Darrick Penna: Moderate sized external hemorrhoids, normal terminal ileum and colon. Next colonoscopy 2026  . COLONOSCOPY N/A 11/20/2016   Procedure: COLONOSCOPY;  Surgeon: West Bali, MD;  [facility];  Service: Endoscopy;  Laterality: N/A;  12:00pm  . ESOPHAGOGASTRODUODENOSCOPY N/A 01/18/2016   Procedure: ESOPHAGOGASTRODUODENOSCOPY (EGD);  Surgeon: West Bali, MD;  [facility];  Service: Endoscopy;  Laterality: N/A;  145  . ESOPHAGOGASTRODUODENOSCOPY N/A 10/23/2016   Procedure: ESOPHAGOGASTRODUODENOSCOPY (EGD);  Surgeon: West Bali, MD;  [facility];  Service: Endoscopy;  Laterality: N/A;  8:30am  . GIVENS CAPSULE STUDY N/A 10/23/2016   Procedure: GIVENS CAPSULE STUDY;  Surgeon: West Bali, MD;  [facility];  Service: Endoscopy;  Laterality: N/A;  . KNEE SURGERY Left   . NISSEN FUNDOPLICATION     according to prior imaging, patient not clear on what sort of surgery procedures she had. states she had two surgeries for this  . SAVORY DILATION N/A 01/18/2016   Procedure: SAVORY DILATION;  Surgeon: West Bali, MD;  [facility];  Service: Endoscopy;  Laterality: N/A;   Allergies  Allergen Reactions  . Codeine Other (See Comments)        Current Outpatient Medications  Medication Sig    . amLODipine (NORVASC) 5 MG tablet Take 1 tablet by mouth daily.    . carbamazepine (TEGRETOL XR) 400 MG 12 hr tablet Take two (2) tablets by mouth at bedtime    . citalopram (CELEXA) 10 MG tablet Take 10 mg by mouth daily.     Marland Kitchen dicyclomine (BENTYL) 20 MG tablet Take 1 tablet (20 mg total) by mouth 2 (two) times daily.    Marland Kitchen  doxepin (SINEQUAN) 10 MG capsule Take one (1) capsule by mouth at bedtime, as needed for sleep    . ferrous gluconate (FERGON) 324 MG tablet Take 1 tablet by mouth daily.    . furosemide (LASIX) 20 MG tablet Take 20 mg by mouth daily.     Marland Kitchen HM FEXOFENADINE HCL 180 MG tablet Take 180 mg by mouth daily as needed.  5  . lisinopril-hydrochlorothiazide (PRINZIDE,ZESTORETIC) 20-12.5 MG tablet Take 1 tablet by mouth daily.    . metoprolol tartrate (LOPRESSOR) 50 MG tablet Take 50 mg by mouth 2 (two) times daily. Pt has 2 bottles (one has bid and other has daily). States she has been taking both bottles    . ondansetron  (ZOFRAN) 4 MG tablet Take 1 tablet by mouth every 8 (eight) hours as needed.    . pantoprazole (PROTONIX) 40 MG tablet Take 1 tablet (40 mg total) by mouth daily before breakfast.    . potassium chloride (K-DUR) 10 MEQ tablet Take 10 mEq by mouth daily.    Marland Kitchen tetrahydrozoline 0.05 % ophthalmic solution Place 1 drop into both eyes 3 (three) times daily as needed (for dry eyes.).    Marland Kitchen tiZANidine (ZANAFLEX) 4 MG tablet Take 4 mg by mouth 3 (three) times daily as needed for muscle spasms.    . traMADol (ULTRAM) 50 MG tablet Take 50 mg by mouth every 8 (eight) hours as needed. For pain.    Marland Kitchen omeprazole (PRILOSEC) 20 MG capsule Take 1 capsule by mouth daily.    . promethazine (PHENERGAN) 25 MG tablet Take 25 mg by mouth every 6 (six) hours as needed for nausea or vomiting.    .       No current facility-administered medications for this visit.      Review of Systems     Objective:   Physical Exam  Constitutional: She is oriented to person, place, and time. She appears well-developed and well-nourished. No distress.  HENT:  Head: Normocephalic and atraumatic.  Mouth/Throat: Oropharynx is clear and moist. No oropharyngeal exudate.  Eyes: Pupils are equal, round, and reactive to light. No scleral icterus.  Neck: Normal range of motion. Neck supple.  Cardiovascular: Normal rate, regular rhythm and normal heart sounds.  Pulmonary/Chest: Effort normal and breath sounds normal. No respiratory distress.  Abdominal: Soft. Bowel sounds are normal. She exhibits no distension. There is no tenderness.  Musculoskeletal: She exhibits no edema.  Lymphadenopathy:    She has no cervical adenopathy.  Neurological: She is alert and oriented to person, place, and time.  Psychiatric: She has a normal mood and affect.  Vitals reviewed.     Assessment & Plan:

## 2017-11-07 NOTE — Progress Notes (Signed)
cc'ed to pcp °

## 2017-11-07 NOTE — Patient Instructions (Addendum)
COMPLETE SWALLOWING STUDIES IN [place].  FOLLOW A SOFT MECHANICAL DIET.  MEATS SHOULD BE CHOPPED OR GROUND ONLY. SEE INFO BELOW.  DO NOT EAT CHUNKS OF ANYTHING.  CONTINUE YOUR WEIGHT LOSS EFFORTS.  USE ULTRAM AT BEDTIME TO REDUCE ABDOMINAL PAIN.  FOLLOW UP IN 4 MOS.   SOFT MECHANICAL DIET This SOFT MECHANICAL DIET is restricted to:  Foods that are moist, soft-textured, and easy to chew and swallow.   Meats that are ground or are minced no larger than one-quarter inch pieces. Meats are moist with gravy or sauce added.   Foods that do not include bread or bread-like textures except soft pancakes, well-moistened with syrup or sauce.   Textures with some chewing ability required.   Casseroles without rice.   Cooked vegetables that are less than half an inch in size and easily mashed with a fork. No cooked corn, peas, broccoli, cauliflower, cabbage, Brussels sprouts, asparagus, or other fibrous, non-tender or rubbery cooked vegetables.   Canned fruit except for pineapple. Fruit must be cut into pieces no larger than half an inch in size.   Foods that do not include nuts, seeds, coconut, or sticky textures.   FOOD TEXTURES FOR DYSPHAGIA DIET LEVEL 2 -SOFT MECHANICAL DIET (includes all foods on Dysphagia Diet Level 1 - Pureed, in addition to the foods listed below)  FOOD GROUP: Breads. RECOMMENDED: Soft pancakes, well-moistened with syrup or sauce.  AVOID: All others.  FOOD GROUP: Cereals.  RECOMMENDED: Cooked cereals with little texture, including oatmeal. Unprocessed wheat bran stirred into cereals for bulk. Note: If thin liquids are restricted, it is important that all of the liquid is absorbed into the cereal.  AVOID: All dry cereals and any cooked cereals that may contain flax seeds or other seeds or nuts. Whole-grain, dry, or coarse cereals. Cereals with nuts, seeds, dried fruit, and/or coconut.  FOOD GROUP: Desserts. RECOMMENDED: Pudding, custard. Soft fruit pies with  bottom crust only. Canned fruit (excluding pineapple). Soft, moist cakes with icing.Frozen malts, milk shakes, frozen yogurt, eggnog, nutritional supplements, ice cream, sherbet, regular or sugar-free gelatin, or any foods that become thin liquid at either room (70 F) or body temperature (98 F).  AVOID: Dry, coarse cakes and cookies. Anything with nuts, seeds, coconut, pineapple, or dried fruit. Breakfast yogurt with nuts. Rice or bread pudding.  FOOD GROUP: Fats. RECOMMENDED: Butter, margarine, cream for cereal (depending on liquid consistency recommendations), gravy, cream sauces, sour cream, sour cream dips with soft additives, mayonnaise, salad dressings, cream cheese, cream cheese spreads with soft additives, whipped toppings.  AVOID: All fats with coarse or chunky additives.  FOOD GROUP: Fruits. RECOMMENDED: Soft drained, canned, or cooked fruits without seeds or skin. Fresh soft and ripe banana. Fruit juices with a small amount of pulp. If thin liquids are restricted, fruit juices should be thickened to appropriate consistency.  AVOID: Fresh or frozen fruits. Cooked fruit with skin or seeds. Dried fruits. Fresh, canned, or cooked pineapple.  FOOD GROUP: Meats and Meat Substitutes. (Meat pieces should not exceed 1/4 of an inch cube and should be tender.) RECOMMENDED: Moistened ground or cooked meat, poultry, or fish. Moist ground or tender meat may be served with gravy or sauce. Casseroles without rice. Moist macaroni and cheese, well-cooked pasta with meat sauce, tuna noodle casserole, soft, moist lasagna. Moist meatballs, meatloaf, or fish loaf. Protein salads, such as tuna or egg without large chunks, celery, or onion. Cottage cheese, smooth quiche without large chunks. Poached, scrambled, or soft-cooked eggs (egg yolks should not  be "runny" but should be moist and able to be mashed with butter, margarine, or other moisture added to them). (Cook eggs to 160 F or use pasteurized eggs for  safety.) Souffls may have small, soft chunks. Tofu. Well-cooked, slightly mashed, moist legumes, such as baked beans. All meats or protein substitutes should be served with sauces or moistened to help maintain cohesiveness in the oral cavity.  AVOID: Dry meats, tough meats (such as bacon, sausage, hot dogs, bratwurst). Dry casseroles or casseroles with rice or large chunks. Peanut butter. Cheese slices and cubes. Hard-cooked or crisp fried eggs. Sandwiches.Pizza.  FOOD GROUP: Potatoes and Starches. RECOMMENDED: Well-cooked, moistened, boiled, baked, or mashed potatoes. Well-cooked shredded hash brown potatoes that are not crisp. (All potatoes need to be moist and in sauces.)Well-cooked noodles in sauce. Spaetzel or soft dumplings that have been moistened with butter or gravy.  AVOID: Potato skins and chips. Fried or French-fried potatoes. Rice.  FOOD GROUP: Soups. RECOMMENDED: Soups with easy-to-chew or easy-to-swallow meats or vegetables: Particle sizes in soups should be less than 1/2 inch. Soups will need to be thickened to appropriate consistency if soup is thinner than prescribed liquid consistency.  AVOID: Soups with large chunks of meat and vegetables. Soups with rice, corn, peas.  FOOD GROUP: Vegetables. RECOMMENDED: All soft, well-cooked vegetables. Vegetables should be less than a half inch. Should be easily mashed with a fork.  AVOID: Cooked corn and peas. Broccoli, cabbage, Brussels sprouts, asparagus, or other fibrous, non-tender or rubbery cooked vegetables.  FOOD GROUP: Miscellaneous. RECOMMENDED: Jams and preserves without seeds, jelly. Sauces, salsas, etc., that may have small tender chunks less than 1/2 inch. Soft, smooth chocolate bars that are easily chewed.  AVOID: Seeds, nuts, coconut, or sticky foods. Chewy candies such as caramels or licorice.

## 2017-11-07 NOTE — Assessment & Plan Note (Signed)
MOST LIKELY DUE TO ABDOMINAL WALL PAIN/ALTERED BODY MECHANICS. PT WALKS WITH 2 CANES AND BMI > 45.  TAKE ULTRAM QHS. SEE ORTHO TO FIX RIGHT KNEE. FOLLOW UP IN 4 MOS.

## 2017-11-07 NOTE — Assessment & Plan Note (Signed)
EGD AUG 2018 : WNLs. 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 SYMPTOMS NOT CONTROLLED.  COMPLETE SWALLOWING STUDIES IN [place]. FOLLOW A SOFT MECHANICAL DIET.  MEATS SHOULD BE CHOPPED OR GROUND ONLY.  HANDOUT GIVEN. DO NOT EAT CHUNKS OF ANYTHING. CONTINUE YOUR WEIGHT LOSS EFFORTS.  FOLLOW UP IN 4 MOS.

## 2017-11-08 ENCOUNTER — Other Ambulatory Visit: Payer: Self-pay

## 2017-11-08 DIAGNOSIS — R1319 Other dysphagia: Secondary | ICD-10-CM

## 2017-11-08 DIAGNOSIS — R1310 Dysphagia, unspecified: Secondary | ICD-10-CM

## 2017-11-08 NOTE — Progress Notes (Signed)
ON RECALL  °

## 2017-12-05 ENCOUNTER — Ambulatory Visit (HOSPITAL_COMMUNITY)
Admission: RE | Admit: 2017-12-05 | Discharge: 2017-12-05 | Disposition: A | Discharge status: Home / Self Care | Payer: Medicare HMO | Source: Ambulatory Visit | Attending: Gastroenterology | Admitting: Gastroenterology

## 2017-12-05 ENCOUNTER — Encounter (HOSPITAL_COMMUNITY)
Admission: RE | Disposition: A | Discharge status: Home / Self Care | Payer: Self-pay | Source: Ambulatory Visit | Attending: Gastroenterology

## 2017-12-05 DIAGNOSIS — R1310 Dysphagia, unspecified: Secondary | ICD-10-CM | POA: Insufficient documentation

## 2017-12-05 DIAGNOSIS — R12 Heartburn: Secondary | ICD-10-CM | POA: Diagnosis not present

## 2017-12-05 DIAGNOSIS — K449 Diaphragmatic hernia without obstruction or gangrene: Secondary | ICD-10-CM | POA: Insufficient documentation

## 2017-12-05 HISTORY — PX: ESOPHAGEAL MANOMETRY: SHX5429

## 2017-12-05 SURGERY — MANOMETRY, ESOPHAGUS

## 2017-12-05 MED ORDER — LIDOCAINE VISCOUS HCL 2 % MT SOLN
OROMUCOSAL | Status: AC
  Filled 2017-12-05: qty 15

## 2017-12-05 SURGICAL SUPPLY — 2 items
FACESHIELD LNG OPTICON STERILE (SAFETY) IMPLANT
GLOVE BIO SURGEON STRL SZ8 (GLOVE) ×4 IMPLANT

## 2017-12-05 NOTE — Progress Notes (Signed)
Esophageal manometry performed per protocol.  Patient tolerated procedure without complications.  Dr. Nandigam to interpret results. 

## 2017-12-06 ENCOUNTER — Encounter (HOSPITAL_COMMUNITY): Payer: Self-pay | Admitting: Gastroenterology

## 2017-12-14 DIAGNOSIS — R12 Heartburn: Secondary | ICD-10-CM

## 2018-01-16 ENCOUNTER — Telehealth: Payer: Self-pay | Admitting: Gastroenterology

## 2018-01-16 NOTE — Telephone Encounter (Signed)
Pt LMOM that she was in pain and needed something for it. I transferred call to DS VM

## 2018-01-16 NOTE — Telephone Encounter (Signed)
I spoke to pt and she said she has had pain in her upper abdomen for several months. It is worse today.  She ate a [place] sandwich and it made it worse.  She has not had any nausea or vomiting. She has regular BM's, about 3 daily, with formed stool, but not hard. She has not seen blood in her stool.  She rates her pain at a 10 right now. I told her she should go to the ED and be evaluated. She said she will go to [facility] since it is closer. She was going to have to wait on a vehicle and I told her if it's not there right away to call EMS and she said she would.

## 2018-01-22 NOTE — Telephone Encounter (Signed)
REVIEWED. AGREE. NO ADDITIONAL RECOMMENDATIONS. 

## 2018-01-22 NOTE — Telephone Encounter (Signed)
PT called and said her stomach was still hurting, has been hurting all week. She went to the ED at [facility] last week, and had to leave because of transportation before the doctor could see her. She is not eating and having nausea and some vomiting.  Her pain is in her upper abdomen and has been constant and she rates it at a 10 and said it has been that way all week. I told her to go to the ED now, I asked could she go to [facility] and she said no, that is too far. She said she will go to ED in [place].

## 2018-02-07 ENCOUNTER — Other Ambulatory Visit (HOSPITAL_COMMUNITY): Payer: Medicaid Other

## 2018-02-14 ENCOUNTER — Ambulatory Visit (HOSPITAL_COMMUNITY): Payer: Medicaid Other | Admitting: Hematology

## 2018-02-26 ENCOUNTER — Encounter: Payer: Self-pay | Admitting: Gastroenterology

## 2018-04-03 ENCOUNTER — Ambulatory Visit: Payer: Medicare HMO | Admitting: Gastroenterology

## 2018-05-14 ENCOUNTER — Telehealth: Payer: Self-pay

## 2018-05-14 NOTE — Telephone Encounter (Signed)
I contacted the pt in regard to doing a vv with Dr. Anne Hahn on 4/15. Pt was agreeable  Due to current COVID 19 pandemic, our office is severely reducing in office visits for at least the next 2 weeks, in order to minimize the risk to our patients and healthcare providers.    Pt understands that although there may be some limitations with this type of visit, we will take all precautions to reduce any security or privacy concerns.  Pt understands that this will be treated like an in office visit and we will file with pt's insurance, and there may be a patient responsible charge related to this service.  Pt's email is [email or web address]. Pt understands that the [employer] webex software must be downloaded and operational on the device pt plans to use for the visit.  EMR updated.

## 2018-05-15 ENCOUNTER — Telehealth: Payer: Self-pay | Admitting: Neurology

## 2018-05-15 ENCOUNTER — Ambulatory Visit (INDEPENDENT_AMBULATORY_CARE_PROVIDER_SITE_OTHER): Payer: Medicare Other | Admitting: Neurology

## 2018-05-15 ENCOUNTER — Encounter: Payer: Self-pay | Admitting: Neurology

## 2018-05-15 ENCOUNTER — Other Ambulatory Visit: Payer: Self-pay

## 2018-05-15 DIAGNOSIS — R269 Unspecified abnormalities of gait and mobility: Secondary | ICD-10-CM

## 2018-05-15 NOTE — Progress Notes (Signed)
The patient never entered the WebEx meeting.

## 2018-05-15 NOTE — Telephone Encounter (Signed)
This patient no showed for a new patient appointment today.  She was unavailable during a scheduled virtual visit, I called both numbers listed, unable to reach her, she never made connection with the WebEx meeting.

## 2018-05-23 ENCOUNTER — Other Ambulatory Visit: Payer: Self-pay

## 2018-05-23 ENCOUNTER — Other Ambulatory Visit: Payer: Self-pay | Admitting: *Deleted

## 2018-05-23 ENCOUNTER — Encounter: Payer: Self-pay | Admitting: Gastroenterology

## 2018-05-23 ENCOUNTER — Ambulatory Visit (INDEPENDENT_AMBULATORY_CARE_PROVIDER_SITE_OTHER): Payer: Medicare Other | Admitting: Gastroenterology

## 2018-05-23 DIAGNOSIS — R112 Nausea with vomiting, unspecified: Secondary | ICD-10-CM | POA: Diagnosis not present

## 2018-05-23 NOTE — Progress Notes (Signed)
Subjective:    Patient ID: Tracey Mccarthy, female    DOB: [birth year], 53 y.o.   MRN: [id number]    Primary Care Physician:  Tracey ShearsHux, Stephen, MD  Primary GI:  Tracey EvaSandi Kishan [place], MD   Patient Location: home   Provider Location: [facility] office   Reason for Visit:    Persons present on the virtual encounter, with roles: patient, myself (provider), MARTINA BOOTH CMA (update meds/allergies)   Total time (minutes) spent on medical discussion:  23 MINUTES   Due to COVID-19, visit was VIA TELEPHONE VISIT DUE TO COVID 19. VISIT IS CONDUCTED VIRTUALLY AND WAS REQUESTED BY PATIENT.   Virtual Visit via TELEPHONE: [phone]   I connected with Tracey Mccarthy and verified that I am speaking with the correct person using two identifiers.   I discussed the limitations, risks, security and privacy concerns of performing an evaluation and management service by telephone/video and the availability of in person appointments. I also discussed with the patient that there may be a patient responsible charge related to this service. The patient expressed understanding and agreed to proceed.  HPI MANOMETRY IN NOV 2019: NL ESOPHAGEALFEELS LIKE SYMPTOMS ARE WORSE. WEIGHING 254 LBS NOW. HAVING UPPER(SHARP) OR LOWER(SHARP) PAIN. IT FEELS LIKE HAVING A BABY.  FOOD MAKES BOTH WORSE. WHEN SHE THROWS SHE SEES THE FOOD. EATS AND WHEN SHE EATS IT WON'T STAY DOWN. THROWING UP ALMOST EVERY DAY/EVERY TIME SHE EATS. EATS TWO MEALS A DAY. EATS A REGULAR. APPETITE: IF REALLY HUNGRY, TAKES HER A WHILE TO FINISH A PLATE. BMs: 2X/DAY-NL. LIQUIDS AND SOLIDS GIVE HER PROBLEMS.   PT DENIES FEVER, CHILLS, HEMATOCHEZIA, HEMATEMESIS, melena, diarrhea, CHEST PAIN, SHORTNESS OF BREATH, CHANGE IN BOWEL IN HABITS, constipation, problems swallowing, problems with sedation, heartburn or indigestion, RINGING IN YOUR IN HER EARS, VISUAL CHANGES, RUNNY NOSE, POSTERIOR DRAINAGE, DIZZINESS, OR TROUBLE WALKING.  Past Medical History:  Diagnosis Date   . Anxiety   . Degenerative arthritis   . Depression   . GERD (gastroesophageal reflux disease)   . Headache   . Hypertension   . Obese   . Optic neuritis   . Rheumatoid arthritis [facility](HCC)    Past Surgical History:  Procedure Laterality Date  . ABDOMINAL HYSTERECTOMY    . arm surgery Right    knot removed  . BIOPSY  01/18/2016   Procedure: BIOPSY;  Surgeon: West BaliSandi L Keawe Marcello, MD;  Location: [facility];  Service: Endoscopy;;  gastric  . COLONOSCOPY N/A 05/18/2014   Dr. Darrick Pennafields: Moderate sized external hemorrhoids, normal terminal ileum and colon. Next colonoscopy 2026  . COLONOSCOPY N/A 11/20/2016   Procedure: COLONOSCOPY;  Surgeon: West BaliFields, Chakia Counts L, MD;  Location: [facility];  Service: Endoscopy;  Laterality: N/A;  12:00pm  . ESOPHAGEAL MANOMETRY N/A 12/05/2017   Procedure: ESOPHAGEAL MANOMETRY (EM);  Surgeon: Napoleon FormNandigam, Kavitha V, MD;  Location: [facility];  Service: Endoscopy;  Laterality: N/A;  . ESOPHAGOGASTRODUODENOSCOPY N/A 01/18/2016   Procedure: ESOPHAGOGASTRODUODENOSCOPY (EGD);  Surgeon: West BaliSandi L Zailynn Brandel, MD;  Location: [facility];  Service: Endoscopy;  Laterality: N/A;  145  . ESOPHAGOGASTRODUODENOSCOPY N/A 10/23/2016   Procedure: ESOPHAGOGASTRODUODENOSCOPY (EGD);  Surgeon: West BaliFields, Retina Bernardy L, MD;  Location: [facility];  Service: Endoscopy;  Laterality: N/A;  8:30am  . GIVENS CAPSULE STUDY N/A 10/23/2016   Procedure: GIVENS CAPSULE STUDY;  Surgeon: West BaliFields, Earle Troiano L, MD;  Location: [facility];  Service: Endoscopy;  Laterality: N/A;  . KNEE SURGERY Left   . NISSEN FUNDOPLICATION     according to  prior imaging, patient not clear on what sort of surgery procedures she had. states she had two surgeries for this  . SAVORY DILATION N/A 01/18/2016   Procedure: SAVORY DILATION;  Surgeon: West Bali, MD;  Location: [facility];  Service: Endoscopy;  Laterality: N/A;   Allergies  Allergen Reactions  . Codeine Other (See Comments)    Hallucinate  Hallucinations    Current  Outpatient Medications  Medication Sig    . carbamazepine (TEGRETOL XR) 400 MG 12 hr tablet Take two (2) tablets by mouth at bedtime    . citalopram (CELEXA) 10 MG tablet Take 10 mg by mouth daily.     Marland Kitchen dicyclomine (BENTYL) 20 MG tablet Take 1 tablet (20 mg total) by mouth 2 (two) times daily.    Marland Kitchen doxepin (SINEQUAN) 10 MG capsule Take one (1) capsule by mouth at bedtime, as needed for sleep    . ferrous gluconate (FERGON) 324 MG tablet Take 1 tablet by mouth daily.    . furosemide (LASIX) 20 MG tablet Take 20 mg by mouth daily.     Marland Kitchen HM FEXOFENADINE HCL 180 MG tablet Take 180 mg by mouth daily as needed.    Marland Kitchen lisinopril-hydrochlorothiazide (PRINZIDE,ZESTORETIC) 20-12.5 MG tablet Take 1 tablet by mouth daily.    . metoprolol tartrate (LOPRESSOR) 50 MG tablet Take 50 mg by mouth 2 (two) times daily. Pt has 2 bottles (one has bid and other has daily). States she has been taking both bottles    . omeprazole (PRILOSEC) 20 MG capsule Take 1 capsule by mouth daily.    . ondansetron (ZOFRAN) 4 MG tablet Take 1 tablet by mouth every 8 (eight) hours as needed.    . pantoprazole (PROTONIX) 40 MG tablet Take 1 tablet (40 mg total) by mouth daily before breakfast.    . potassium chloride (K-DUR) 10 MEQ tablet Take 10 mEq by mouth daily.    . promethazine (PHENERGAN) 25 MG tablet Take 25 mg by mouth every 6 (six) hours as needed for nausea or vomiting.    Marland Kitchen tetrahydrozoline 0.05 % ophthalmic solution Place 1 drop into both eyes 3 (three) times daily as needed (for dry eyes.).    Marland Kitchen traMADol (ULTRAM) 50 MG tablet Take 50 mg by mouth every 8 (eight) hours as needed. For pain.    Marland Kitchen sucralfate (CARAFATE) 1 g tablet Take 1 tablet (1 g total) by mouth 4 (four) times daily -  with meals and at bedtime for 7 days.    Marland Kitchen tiZANidine (ZANAFLEX) 4 MG tablet Take 4 mg by mouth 3 (three) times daily as needed for muscle spasms.     Review of Systems PER HPI OTHERWISE ALL SYSTEMS ARE NEGATIVE.    Objective:   Physical  Exam  TELEPHONE VISIT DUE TO COVID 19, VISIT IS CONDUCTED VIRTUALLY AND WAS REQUESTED BY PATIENT.     Assessment & Plan:

## 2018-05-23 NOTE — Assessment & Plan Note (Signed)
LIKELY CONTRIBUTING TO POSTPRANDIAL VOMITING DUE TO DIETARY NO-ADHERENCE.  DRINK WATER TO KEEP YOUR URINE LIGHT YELLOW. CONTINUE YOUR WEIGHT LOSS EFFORTS. YOUR BODY MASS INDEX IS OVER 40 WHICH MEANS YOU ARE MORBIDLY OBESE. OBESITY IS ASSOCIATED WITH AN INCREASED FOR CIRRHOSIS AND ALL CANCERS, INCLUDING ESOPHAGEAL AND COLON CANCER. A WEIGHT OF 215 LBS OR LESS WILL GET YOUR BODY MASS INDEX(BMI) UNDER 40. SEE NUTRITION FOR GUIDANCE ON SOFT MECHANICAL DIET AND WEIGHT LOSS.  FOLLOW UP IN 6 MOS.

## 2018-05-23 NOTE — Patient Instructions (Addendum)
DRINK WATER TO KEEP YOUR URINE LIGHT YELLOW.  CONTINUE YOUR WEIGHT LOSS EFFORTS. YOUR BODY MASS INDEX IS OVER 40 WHICH MEANS YOU ARE MORBIDLY OBESE. OBESITY IS ASSOCIATED WITH AN INCREASED FOR CIRRHOSIS AND ALL CANCERS, INCLUDING ESOPHAGEAL AND COLON CANCER. A WEIGHT OF 215 LBS OR LESS WILL GET YOUR BODY MASS INDEX(BMI) UNDER 40.   FOLLOW A SOFT MECHANICAL DIET. MEATS SHOULD BE BAKED, BROILED, OR BOILED. DO NOT EAT CHUNKS OF ANYTHING. SEE INFO BELOW.   SEE NUTRITION FOR GUIDANCE ON SOFT MECHANICAL DIET AND WEIGHT LOSS.  SEE GENERAL SURGERY AT [facility] TO DISCUSS BENEFITS V. RISKS OF REPEAT SURGERY TO RELIVE THE VOMITING AFTER EATING.  FOLLOW UP IN 6 MOS.   SOFT MECHANICAL DIET This SOFT MECHANICAL DIET is restricted to:  Foods that are moist, soft-textured, and easy to chew and swallow.   Meats that are ground or are minced no larger than one-quarter inch pieces. Meats are moist with gravy or sauce added.   Foods that do not include bread or bread-like textures except soft pancakes, well-moistened with syrup or sauce.   Textures with some chewing ability required.   Casseroles without rice.   Cooked vegetables that are less than half an inch in size and easily mashed with a fork. No cooked corn, peas, broccoli, cauliflower, cabbage, Brussels sprouts, asparagus, or other fibrous, non-tender or rubbery cooked vegetables.   Canned fruit except for pineapple. Fruit must be cut into pieces no larger than half an inch in size.   Foods that do not include nuts, seeds, coconut, or sticky textures.   FOOD TEXTURES FOR DYSPHAGIA DIET LEVEL 2 -SOFT MECHANICAL DIET (includes all foods on Dysphagia Diet Level 1 - Pureed, in addition to the foods listed below)  FOOD GROUP: Breads. RECOMMENDED: Soft pancakes, well-moistened with syrup or sauce.  AVOID: All others.  FOOD GROUP: Cereals.  RECOMMENDED: Cooked cereals with little texture, including oatmeal. Unprocessed wheat bran stirred  into cereals for bulk. Note: If thin liquids are restricted, it is important that all of the liquid is absorbed into the cereal.  AVOID: All dry cereals and any cooked cereals that may contain flax seeds or other seeds or nuts. Whole-grain, dry, or coarse cereals. Cereals with nuts, seeds, dried fruit, and/or coconut.  FOOD GROUP: Desserts. RECOMMENDED: Pudding, custard. Soft fruit pies with bottom crust only. Canned fruit (excluding pineapple). Soft, moist cakes with icing.Frozen malts, milk shakes, frozen yogurt, eggnog, nutritional supplements, ice cream, sherbet, regular or sugar-free gelatin, or any foods that become thin liquid at either room (70 F) or body temperature (98 F).  AVOID: Dry, coarse cakes and cookies. Anything with nuts, seeds, coconut, pineapple, or dried fruit. Breakfast yogurt with nuts. Rice or bread pudding.  FOOD GROUP: Fats. RECOMMENDED: Butter, margarine, cream for cereal (depending on liquid consistency recommendations), gravy, cream sauces, sour cream, sour cream dips with soft additives, mayonnaise, salad dressings, cream cheese, cream cheese spreads with soft additives, whipped toppings.  AVOID: All fats with coarse or chunky additives.  FOOD GROUP: Fruits. RECOMMENDED: Soft drained, canned, or cooked fruits without seeds or skin. Fresh soft and ripe banana. Fruit juices with a small amount of pulp. If thin liquids are restricted, fruit juices should be thickened to appropriate consistency.  AVOID: Fresh or frozen fruits. Cooked fruit with skin or seeds. Dried fruits. Fresh, canned, or cooked pineapple.  FOOD GROUP: Meats and Meat Substitutes. (Meat pieces should not exceed 1/4 of an inch cube and should be tender.) RECOMMENDED: Moistened ground  or cooked meat, poultry, or fish. Moist ground or tender meat may be served with gravy or sauce. Casseroles without rice. Moist macaroni and cheese, well-cooked pasta with meat sauce, tuna noodle casserole, soft, moist  lasagna. Moist meatballs, meatloaf, or fish loaf. Protein salads, such as tuna or egg without large chunks, celery, or onion. Cottage cheese, smooth quiche without large chunks. Poached, scrambled, or soft-cooked eggs (egg yolks should not be "runny" but should be moist and able to be mashed with butter, margarine, or other moisture added to them). (Cook eggs to 160 F or use pasteurized eggs for safety.) Souffls may have small, soft chunks. Tofu. Well-cooked, slightly mashed, moist legumes, such as baked beans. All meats or protein substitutes should be served with sauces or moistened to help maintain cohesiveness in the oral cavity.  AVOID: Dry meats, tough meats (such as bacon, sausage, hot dogs, bratwurst). Dry casseroles or casseroles with rice or large chunks. Peanut butter. Cheese slices and cubes. Hard-cooked or crisp fried eggs. Sandwiches.Pizza.  FOOD GROUP: Potatoes and Starches. RECOMMENDED: Well-cooked, moistened, boiled, baked, or mashed potatoes. Well-cooked shredded hash brown potatoes that are not crisp. (All potatoes need to be moist and in sauces.)Well-cooked noodles in sauce. Spaetzel or soft dumplings that have been moistened with butter or gravy.  AVOID: Potato skins and chips. Fried or French-fried potatoes. Rice.  FOOD GROUP: Soups. RECOMMENDED: Soups with easy-to-chew or easy-to-swallow meats or vegetables: Particle sizes in soups should be less than 1/2 inch. Soups will need to be thickened to appropriate consistency if soup is thinner than prescribed liquid consistency.  AVOID: Soups with large chunks of meat and vegetables. Soups with rice, corn, peas.  FOOD GROUP: Vegetables. RECOMMENDED: All soft, well-cooked vegetables. Vegetables should be less than a half inch. Should be easily mashed with a fork.  AVOID: Cooked corn and peas. Broccoli, cabbage, Brussels sprouts, asparagus, or other fibrous, non-tender or rubbery cooked vegetables.  FOOD GROUP:  Miscellaneous. RECOMMENDED: Jams and preserves without seeds, jelly. Sauces, salsas, etc., that may have small tender chunks less than 1/2 inch. Soft, smooth chocolate bars that are easily chewed.  AVOID: Seeds, nuts, coconut, or sticky foods. Chewy candies such as caramels or licorice.

## 2018-05-23 NOTE — Assessment & Plan Note (Signed)
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 PT SEEN AND EVALUATED BY SURGERY IN MAY 2018. DECLINED TO OPERATE AND RECOMMENDED CONSERVATIVE MEASURES. PT'S BMI REMAINS OVER 40 AND HER SYMPTOMS ARE NOT IDEALLY CONTROLLED.  DISCUSSED THE UNDERLYING CAUSE FOR HER SYMPTOMS AND MANAGEMENT OPTIONS. EXPLAINED IF HER BMI REMAINS OVER 40 SURGERY IS NOT LIKELY TO OPERATE. DRINK WATER TO KEEP YOUR URINE LIGHT YELLOW. CONTINUE YOUR WEIGHT LOSS EFFORTS. YOUR BODY MASS INDEX IS OVER 40 WHICH MEANS YOU ARE MORBIDLY OBESE. OBESITY IS ASSOCIATED WITH AN INCREASED FOR CIRRHOSIS AND ALL CANCERS, INCLUDING ESOPHAGEAL AND COLON CANCER. A WEIGHT OF 215 LBS OR LESS WILL GET YOUR BODY MASS INDEX(BMI) UNDER 40. FOLLOW A SOFT MECHANICAL DIET. MEATS SHOULD BE BAKED, BROILED, OR BOILED. DO NOT EAT CHUNKS OF ANYTHING.  HANDOUT GIVEN. REFER TO NUTRITION FOR GUIDANCE ON SOFT MECHANICAL DIET AND WEIGHT LOSS. REFER TO GENERAL SURGERY AT [facility] TO DISCUSS BENEFITS V. RISKS OF REPEAT SURGERY TO RELIVE THE VOMITING AFTER EATING. FOLLOW UP IN 6 MOS.

## 2018-05-24 NOTE — Progress Notes (Signed)
ON RECALL  °

## 2018-05-28 NOTE — Progress Notes (Signed)
CC'D TO PCP °

## 2018-05-29 ENCOUNTER — Institutional Professional Consult (permissible substitution): Payer: Medicare Other | Admitting: Neurology

## 2018-05-30 ENCOUNTER — Telehealth: Payer: Self-pay | Admitting: Nutrition

## 2018-05-30 ENCOUNTER — Ambulatory Visit: Payer: Medicaid Other | Admitting: Nutrition

## 2018-05-30 NOTE — Telephone Encounter (Signed)
REVIEWED. TRIED HOME PHONE. NO ANSWER. TRIED CELL #: NO LONGER HER PH #.

## 2018-05-30 NOTE — Telephone Encounter (Signed)
TC to do phone visit. No answer. No VM

## 2018-05-30 NOTE — Progress Notes (Signed)
Opened in error

## 2018-05-30 NOTE — Telephone Encounter (Signed)
No answer. No VM

## 2018-07-24 ENCOUNTER — Ambulatory Visit: Payer: Medicaid Other | Admitting: Nutrition

## 2018-08-12 ENCOUNTER — Telehealth: Payer: Self-pay

## 2018-08-12 NOTE — Telephone Encounter (Signed)
Pt is inquiring about her CT results from April 2020. Contact number [phone]

## 2018-08-12 NOTE — Telephone Encounter (Signed)
Pt's last visit was by telephone on 05/23/2018 with Dr. Oneida Alar. I told her we did not order a CT in April this year.  She said she went to [facility] sometime and she must be confused.  She wanted to know when her next appointment with Dr. Oneida Alar would be. She is down to be notified in Sept for Oct visit. She said she has been having abdominal pain for the last 3 weeks and cannot get any sleep at night. She has pain in the top of her abdomen and it is tender and sore to touch.  She has regular BM's and no blood in stool. No nausea or vomiting. Dr. Oneida Alar, please advise!

## 2018-08-13 MED ORDER — LIDOCAINE VISCOUS HCL 2 % MT SOLN
OROMUCOSAL | 5 refills | Status: AC
Start: 2018-08-13 — End: ?

## 2018-08-13 NOTE — Telephone Encounter (Signed)
LMOM that the Rx was sent in.  

## 2018-08-13 NOTE — Telephone Encounter (Signed)
PLEASE CALL PT. SHE HAS REFLUX. THIS WILL CAUSE UPPER ABDOMINAL PAIN THAT SOMETIMES RADIATES TO HER BACK. SHE SHOULD STRICTLY AVOID REFLUX TRIGGERS. DO NOT DEVIATE FROM A  LOW FAT DIET.    TO CONTROL HEARTBURN:   1. DRINK WATER TO KEEP YOUR URINE LIGHT YELLOW.    2. CONTINUE YOUR WEIGHT LOSS EFFORTS. YOUR BODY MASS INDEX IS OVER 30 WHICH MEANS YOU ARE OBESE. OBESITY IS ASSOCIATED WITH AN INCREASED RISK FOR UNCONTROLLED REFLUX, CIRRHOSIS, AND ALL CANCERS, INCLUDING ESOPHAGEAL AND COLON CANCER.    3. Avoid reflux triggers. SEE INFO BELOW.    5. STRICTLY FOLLOW A LOW FAT DIET. MEATS SHOULD BE BAKED, BROILED, OR BOILED. AVOID FRIED FOODS.    6. CONTINUE PROTONIX. TAKE 30 MINUTES PRIOR TO MEALS TWICE DAILY.    7. USE PEPCID OR TAGAMET FOR BREAKTHROUGH HEARTBURN/REFLUX.    8. USE VISCOUS LIDOCAINE 2 TSP EVERY 4 HOURS WHEN NEEDED FOR FLARES OF HEARTBURN, CHEST PAIN, OR UPPER ABDOMINAL PAIN. USE A SYRINGE TO INJECT INTO THE BACK OF YOUR THROAT. USE NO MORE THAN 8 DOSES A DAY. IT WILL MAKE YOUR MOUTH, ESOPHAGUS, AND STOMACH NUMB.   Lifestyle and home remedies TO MANAGE REFLUX/CHEST PAIN  You may eliminate or reduce the frequency of heartburn by making the following lifestyle changes:  . Control your weight. Being overweight is a major risk factor for heartburn and GERD. Excess pounds put pressure on your abdomen, pushing up your stomach and causing acid to back up into your esophagus.   . Eat smaller meals. 4 TO 6 MEALS A DAY. This reduces pressure on the lower esophageal sphincter, helping to prevent the valve from opening and acid from washing back into your esophagus.   Allena Earing your belt. Clothes that fit tightly around your waist put pressure on your abdomen and the lower esophageal sphincter.   . Eliminate heartburn triggers. Everyone has specific triggers. Common triggers such as fatty or fried foods, spicy food, tomato sauce, carbonated beverages, alcohol, chocolate, mint, garlic,  onion, caffeine and nicotine may make heartburn worse.   Marland Kitchen Avoid stooping or bending. Tying your shoes is OK. Bending over for longer periods to weed your garden isn't, especially soon after eating.   . Don't lie down after a meal. Wait at least three to four hours after eating before going to bed, and don't lie down right after eating.   Marland Kitchen PUT THE HEAD OF YOUR BED ON 6 INCH BLOCKS.   Alternative medicine . Several home remedies exist for treating GERD, but they provide only temporary relief. They include drinking baking soda (sodium bicarbonate) added to water or drinking other fluids such as baking soda mixed with cream of tartar and water.  . Although these liquids create temporary relief by neutralizing, washing away or buffering acids, eventually they aggravate the situation by adding gas and fluid to your stomach, increasing pressure and causing more acid reflux. Further, adding more sodium to your diet may increase your blood pressure and add stress to your heart, and excessive bicarbonate ingestion can alter the acid-base balance in your body.  . Low-Fat Diet . BREADS, CEREALS, PASTA, RICE, DRIED PEAS, AND BEANS . These products are high in carbohydrates and most are low in fat. Therefore, they can be increased in the diet as substitutes for fatty foods. They too, however, contain calories and should not be eaten in excess. Cereals can be eaten for snacks as well as for breakfast.  .  . FRUITS AND VEGETABLES .  It is good to eat fruits and vegetables. Besides being sources of fiber, both are rich in vitamins and some minerals. They help you get the daily allowances of these nutrients. Fruits and vegetables can be used for snacks and desserts. .  . MEATS . Limit lean meat, chicken, [place], and fish to no more than 6 ounces per day. . Beef, Pork, and Lamb . Use lean cuts of beef, pork, and lamb. Lean cuts include:  Marland Kitchen Extra-lean ground beef.  . Arm roast.  . Sirloin tip.  . Center-cut  ham.  . Round steak.  . Loin chops.  . Rump roast.  . Tenderloin.  Marily Memos all fat off the outside of meats before cooking. It is not necessary to severely decrease the intake of red meat, but lean choices should be made. Lean meat is rich in protein and contains a highly absorbable form of iron. Premenopausal women, in particular, should avoid reducing lean red meat because this could increase the risk for low red blood cells (iron-deficiency anemia). .  . Chicken and [place] . These are good sources of protein. The fat of poultry can be reduced by removing the skin and underlying fat layers before cooking. Chicken and [place] can be substituted for lean red meat in the diet. Poultry should not be fried or covered with high-fat sauces. . Fish and Shellfish . Fish is a good source of protein. Shellfish contain cholesterol, but they usually are low in saturated fatty acids. The preparation of fish is important. Like chicken and [place], they should not be fried or covered with high-fat sauces. . EGGS . Egg whites contain no fat or cholesterol. They can be eaten often. Try 1 to 2 egg whites instead of whole eggs in recipes or use egg substitutes that do not contain yolk. Marland Kitchen MILK AND DAIRY PRODUCTS . Use skim or 1% milk instead of 2% or whole milk. Decrease whole milk, natural, and processed cheeses. Use nonfat or low-fat (2%) cottage cheese or low-fat cheeses made from vegetable oils. Choose nonfat or low-fat (1 to 2%) yogurt. Experiment with evaporated skim milk in recipes that call for heavy cream. Substitute low-fat yogurt or low-fat cottage cheese for sour cream in dips and salad dressings. Have at least 2 servings of low-fat dairy products, such as 2 glasses of skim (or 1%) milk each day to help get your daily calcium intake. Marland Kitchen FATS AND OILS . Reduce the total intake of fats, especially saturated fat. Butterfat, lard, and beef fats are high in saturated fat and cholesterol. These should be avoided as  much as possible. Vegetable fats do not contain cholesterol, but certain vegetable fats, such as coconut oil, palm oil, and palm kernel oil are very high in saturated fats. These should be limited. These fats are often used in bakery goods, processed foods, popcorn, oils, and nondairy creamers. Vegetable shortenings and some peanut butters contain hydrogenated oils, which are also saturated fats. Read the labels on these foods and check for saturated vegetable oils. . Unsaturated vegetable oils and fats do not raise blood cholesterol. However, they should be limited because they are fats and are high in calories. Total fat should still be limited to 30% of your daily caloric intake. Desirable liquid vegetable oils are corn oil, cottonseed oil, olive oil, canola oil, safflower oil, soybean oil, and sunflower oil. Peanut oil is not as good, but small amounts are acceptable. Buy a heart-healthy tub margarine that has no partially hydrogenated oils in the ingredients.  Mayonnaise and salad dressings often are made from unsaturated fats, but they should also be limited because of their high calorie and fat content. . Seeds, nuts, peanut butter, olives, and avocados are high in fat, but the fat is mainly the unsaturated type. These foods should be limited mainly to avoid excess calories and fat. . OTHER EATING TIPS . Snacks  . Most sweets should be limited as snacks. They tend to be rich in calories and fats, and their caloric content outweighs their nutritional value. Some good choices in snacks are graham crackers, melba toast, soda crackers, bagels (no egg), English muffins, fruits, and vegetables. These snacks are preferable to snack crackers, [place] fries, TORTILLA CHIPS, and POTATO chips. Popcorn should be air-popped or cooked in small amounts of liquid vegetable oil. . Desserts . Eat fruit, low-fat yogurt, and fruit ices instead of pastries, cake, and cookies. Sherbet, angel food cake, gelatin dessert, frozen  low-fat yogurt, or other frozen products that do not contain saturated fat (pure fruit juice bars, frozen ice pops) are also acceptable.  . COOKING METHODS . Choose those methods that use little or no fat. They include: . Poaching.  . Braising.  . Steaming.  Pascal Lux.  . Baking.  . Stir-frying.  . Broiling.  . Microwaving.  . Foods can be cooked in a nonstick pan without added fat, or use a nonfat cooking spray in regular cookware. Limit fried foods and avoid frying in saturated fat. Add moisture to lean meats by using water, broth, cooking wines, and other nonfat or low-fat sauces along with the cooking methods mentioned above. Marland Kitchen Soups and stews should be chilled after cooking. The fat that forms on top after a few hours in the refrigerator should be skimmed off. When preparing meals, avoid using excess salt. Salt can contribute to raising blood pressure in some people. .  . EATING AWAY FROM HOME . Order entres, potatoes, and vegetables without sauces or butter. When meat exceeds the size of a deck of cards (3 to 4 ounces), the rest can be taken home for another meal. . Choose vegetable or fruit salads and ask for low-calorie salad dressings to be served on the side. Use dressings sparingly. Limit high-fat toppings, such as bacon, crumbled eggs, cheese, sunflower seeds, and olives. Ask for heart-healthy tub margarine instead of butter.

## 2018-08-13 NOTE — Addendum Note (Signed)
Addended by: Danie Binder on: 08/13/2018 02:45 PM   Modules accepted: Orders

## 2018-08-13 NOTE — Telephone Encounter (Signed)
PLEASE CALL PT.  Rx sent.  

## 2018-08-13 NOTE — Telephone Encounter (Signed)
PT is aware and needs Rx for Viscous Lidocaine sent to [facility].

## 2018-10-29 ENCOUNTER — Encounter: Payer: Self-pay | Admitting: Gastroenterology

## 2020-03-29 ENCOUNTER — Emergency Department (HOSPITAL_COMMUNITY)
Admission: EM | Admit: 2020-03-29 | Discharge: 2020-03-29 | Disposition: A | Discharge status: Home / Self Care | Payer: 59 | Attending: Emergency Medicine | Admitting: Emergency Medicine

## 2020-03-29 ENCOUNTER — Emergency Department (HOSPITAL_COMMUNITY): Payer: 59

## 2020-03-29 ENCOUNTER — Encounter (HOSPITAL_COMMUNITY): Payer: Self-pay | Admitting: *Deleted

## 2020-03-29 DIAGNOSIS — Z79899 Other long term (current) drug therapy: Secondary | ICD-10-CM | POA: Diagnosis not present

## 2020-03-29 DIAGNOSIS — I10 Essential (primary) hypertension: Secondary | ICD-10-CM | POA: Insufficient documentation

## 2020-03-29 DIAGNOSIS — R0789 Other chest pain: Secondary | ICD-10-CM | POA: Diagnosis not present

## 2020-03-29 DIAGNOSIS — R079 Chest pain, unspecified: Secondary | ICD-10-CM | POA: Diagnosis present

## 2020-03-29 LAB — CBC
HCT: 38.7 % (ref 36.0–46.0)
Hemoglobin: 11.9 g/dL — ABNORMAL LOW (ref 12.0–15.0)
MCH: 22.8 pg — ABNORMAL LOW (ref 26.0–34.0)
MCHC: 30.7 g/dL (ref 30.0–36.0)
MCV: 74 fL — ABNORMAL LOW (ref 80.0–100.0)
Platelets: 356 10*3/uL (ref 150–400)
RBC: 5.23 MIL/uL — ABNORMAL HIGH (ref 3.87–5.11)
RDW: 16.6 % — ABNORMAL HIGH (ref 11.5–15.5)
WBC: 7.6 10*3/uL (ref 4.0–10.5)
nRBC: 0 % (ref 0.0–0.2)

## 2020-03-29 LAB — BASIC METABOLIC PANEL
Anion gap: 11 (ref 5–15)
BUN: 12 mg/dL (ref 6–20)
CO2: 28 mmol/L (ref 22–32)
Calcium: 9.3 mg/dL (ref 8.9–10.3)
Chloride: 100 mmol/L (ref 98–111)
Creatinine, Ser: 0.95 mg/dL (ref 0.44–1.00)
GFR, Estimated: >60 mL/min (ref >60)
Glucose, Bld: 101 mg/dL — ABNORMAL HIGH (ref 70–99)
Potassium: 3.8 mmol/L (ref 3.5–5.1)
Sodium: 139 mmol/L (ref 135–145)

## 2020-03-29 LAB — TROPONIN I (HIGH SENSITIVITY)
Troponin I (High Sensitivity): 2 ng/L (ref <18)
Troponin I (High Sensitivity): 2 ng/L (ref <18)

## 2020-03-29 MED ORDER — KETOROLAC TROMETHAMINE 30 MG/ML IJ SOLN
15.0000 mg | Freq: Once | INTRAMUSCULAR | Status: AC
  Administered 2020-03-29: 15 mg via INTRAMUSCULAR
  Filled 2020-03-29: qty 1

## 2020-03-29 MED ORDER — KETOROLAC TROMETHAMINE 30 MG/ML IJ SOLN: 15.0000 mg | Freq: Once | INTRAMUSCULAR | Status: DC

## 2020-03-29 MED ORDER — TRAMADOL HCL 50 MG PO TABS: 50.0000 mg | ORAL_TABLET | Freq: Three times a day (TID) | ORAL | 0 refills | Status: AC | PRN

## 2020-03-29 NOTE — Discharge Instructions (Signed)
As discussed, your evaluation today has been largely reassuring.  But, it is important that you monitor your condition carefully, and do not hesitate to return to the ED if you develop new, or concerning changes in your condition. ? ?Otherwise, please follow-up with your physician for appropriate ongoing care. ? ?

## 2020-03-29 NOTE — ED Provider Notes (Signed)
[facility] EMERGENCY DEPARTMENT Provider Note   CSN: [id number] Arrival date & time: 03/29/20  1608     History Chief Complaint  Patient presents with  . Chest Pain    Tracey Mccarthy is a 55 y.o. female.  HPI Patient presents with concern of ongoing chest pain. Pain in the superior left paramidline area. Pain is sore, severe, persistent, worse with motion, activity, not necessarily inspiration. Onset was about 2 weeks ago.  She was seen and evaluated at another emergency department at time. She notes that since that time the pain has been persistent, essentially unchanged.  She has not taken medication due to uncertainty about the etiology of her pain.  She denies weight loss weight gain, swelling, anorexia, nausea, vomiting, abdominal pain.  On she does have a known abdominal hernia, but states that this is not currently an issue for her.    Past Medical History:  Diagnosis Date  . Anxiety   . Degenerative arthritis   . Depression   . GERD (gastroesophageal reflux disease)   . Headache   . Hypertension   . Obese   . Optic neuritis   . Rheumatoid arthritis [facility])     Patient Active Problem List   Diagnosis Date Noted  . Morbid obesity (HCC) 05/23/2018  . Heartburn   . Dysphagia 04/17/2017  . Iron deficiency anemia   . Microcytic anemia   . Large hiatal hernia 05/16/2016  . GERD (gastroesophageal reflux disease) 01/17/2016  . Nausea with vomiting 01/17/2016  . Abdominal pain, lower 01/17/2016  . Special screening for malignant neoplasms, colon   . Subjective vision disturbance 12/17/2012  . 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 ABDOMINAL HYSTERECTOMY    . arm surgery Right    knot removed  . BIOPSY  01/18/2016   Procedure: BIOPSY;  Surgeon: West Bali, MD;  Location: [facility];  Service: Endoscopy;;  gastric  . COLONOSCOPY N/A 05/18/2014   Dr. Darrick Penna: Moderate  sized external hemorrhoids, normal terminal ileum and colon. Next colonoscopy 2026  . COLONOSCOPY N/A 11/20/2016   Procedure: COLONOSCOPY;  Surgeon: West Bali, MD;  Location: [facility];  Service: Endoscopy;  Laterality: N/A;  12:00pm  . ESOPHAGEAL MANOMETRY N/A 12/05/2017   Procedure: ESOPHAGEAL MANOMETRY (EM);  Surgeon: Napoleon Form, MD;  Location: [facility];  Service: Endoscopy;  Laterality: N/A;  . ESOPHAGOGASTRODUODENOSCOPY N/A 01/18/2016   Procedure: ESOPHAGOGASTRODUODENOSCOPY (EGD);  Surgeon: West Bali, MD;  Location: [facility];  Service: Endoscopy;  Laterality: N/A;  145  . ESOPHAGOGASTRODUODENOSCOPY N/A 10/23/2016   Procedure: ESOPHAGOGASTRODUODENOSCOPY (EGD);  Surgeon: West Bali, MD;  Location: [facility];  Service: Endoscopy;  Laterality: N/A;  8:30am  . GIVENS CAPSULE STUDY N/A 10/23/2016   Procedure: GIVENS CAPSULE STUDY;  Surgeon: West Bali, MD;  Location: [facility];  Service: Endoscopy;  Laterality: N/A;  . KNEE SURGERY Left   . NISSEN FUNDOPLICATION     according to prior imaging, patient not clear on what sort of surgery procedures she had. states she had two surgeries for this  . SAVORY DILATION N/A 01/18/2016   Procedure: SAVORY DILATION;  Surgeon: West Bali, MD;  Location: [facility];  Service: Endoscopy;  Laterality: N/A;     OB History   No obstetric history on file.     Family History  Problem Relation Age of Onset  .  Pneumonia Father   . Diabetes Father   . Hypertension Father   . Heart attack Father   . Colon cancer Father        greater than 60  . Colon cancer Paternal Aunt        greater than 77    Social History   Tobacco Use  . Smoking status: Never Smoker  . Smokeless tobacco: Never Used  Vaping Use  . Vaping Use: Never used  Substance Use Topics  . Alcohol use: No  . Drug use: No    Home Medications Prior to Admission medications   Medication Sig Start Date End Date Taking? Authorizing  Provider  amitriptyline (ELAVIL) 50 MG tablet Take 50 mg by mouth daily. 09/27/11  Yes [provider]  amLODipine (NORVASC) 5 MG tablet Take 5 mg by mouth daily. 03/24/20  Yes [provider]  carbamazepine (TEGRETOL XR) 400 MG 12 hr tablet Take 800 mg by mouth 2 (two) times daily. 01/15/17  Yes [provider]  citalopram (CELEXA) 10 MG tablet Take 10 mg by mouth daily.    Yes [provider]  doxepin (SINEQUAN) 10 MG capsule Take 20 mg by mouth at bedtime. 01/15/17  Yes [provider]  famotidine (PEPCID) 40 MG tablet Take 40 mg by mouth 2 (two) times daily. 03/24/20  Yes [provider]  ferrous gluconate (FERGON) 324 MG tablet Take 1 tablet by mouth daily. 12/29/16  Yes [provider]  furosemide (LASIX) 20 MG tablet Take 20 mg by mouth daily.    Yes [provider]  glipiZIDE (GLUCOTROL) 5 MG tablet Take 5 mg by mouth 2 (two) times daily. 03/24/20  Yes [provider]  HM FEXOFENADINE HCL 180 MG tablet Take 180 mg by mouth daily as needed. 10/30/17  Yes [provider]  hydrochlorothiazide (HYDRODIURIL) 12.5 MG tablet Take 12.5 mg by mouth daily. 03/03/20  Yes [provider]  lidocaine (XYLOCAINE) 2 % solution Using a syringe, 10 mL PO q4h or 30 MINS PRIOR TO MEALS AND PRN FOR ABDOMINAL/CHEST PAIN. NO MORE> 8 DOSES/DAY. 08/13/18  Yes Fields, Sandi L, MD  losartan (COZAAR) 50 MG tablet Take 50 mg by mouth daily. 03/03/20  Yes [provider]  meloxicam (MOBIC) 7.5 MG tablet Take 1 tablet by mouth daily. 08/20/19  Yes [provider]  metoprolol tartrate (LOPRESSOR) 50 MG tablet Take 50 mg by mouth 2 (two) times daily. Pt has 2 bottles (one has bid and other has daily). States she has been taking both bottles 08/30/16  Yes [provider]  naproxen (NAPROSYN) 500 MG tablet Take 500 mg by mouth 2 (two) times daily as needed. 02/19/20  Yes [provider]  omeprazole  (PRILOSEC) 20 MG capsule Take 1 capsule by mouth daily.   Yes [provider]  Ostomy Supplies (SKIN TAC ADHESIVE BARRIER WIPE) MISC by Does not apply route. 06/20/19  Yes [provider]  pantoprazole (PROTONIX) 40 MG tablet Take 1 tablet (40 mg total) by mouth daily before breakfast. 04/17/17  Yes Tiffany Kocher, PA-C  potassium chloride SA (KLOR-CON) 20 MEQ tablet Take 20 mEq by mouth 2 (two) times daily. 03/16/20  Yes [provider]  tiZANidine (ZANAFLEX) 4 MG tablet Take 4 mg by mouth 3 (three) times daily as needed for muscle spasms.   Yes [provider]  traMADol (ULTRAM) 50 MG tablet Take 50 mg by mouth every 8 (eight) hours as needed. For pain. 08/01/16  Yes  [provider]  dicyclomine (BENTYL) 20 MG tablet Take 1 tablet (20 mg total) by mouth 2 (two) times daily. Patient not taking: No sig reported 05/01/17   Aviva Kluver B, PA-C  lisinopril-hydrochlorothiazide (PRINZIDE,ZESTORETIC) 20-12.5 MG tablet Take 1 tablet by mouth daily. Patient not taking: No sig reported 10/30/17   [provider]  losartan-hydrochlorothiazide (HYZAAR) 50-12.5 MG tablet Take 1 tablet by mouth daily. Patient not taking: No sig reported 03/24/20   [provider]  ondansetron (ZOFRAN) 4 MG tablet Take 1 tablet by mouth every 8 (eight) hours as needed. Patient not taking: Reported on 03/29/2020    [provider]  potassium chloride (K-DUR) 10 MEQ tablet Take 10 mEq by mouth daily. Patient not taking: Reported on 03/29/2020    [provider]  promethazine (PHENERGAN) 25 MG tablet Take 25 mg by mouth every 6 (six) hours as needed for nausea or vomiting. Patient not taking: Reported on 03/29/2020    [provider]  sucralfate (CARAFATE) 1 g tablet Take 1 tablet (1 g total) by mouth 4 (four) times daily -  with meals and at bedtime for 7 days. Patient not taking: Reported on 03/29/2020 05/01/17 05/08/17  Aviva Kluver B, PA-C   tetrahydrozoline 0.05 % ophthalmic solution Place 1 drop into both eyes 3 (three) times daily as needed (for dry eyes.). Patient not taking: Reported on 03/29/2020    [provider]    Allergies    Codeine  Review of Systems   Review of Systems  Constitutional:       Per HPI, otherwise negative  HENT:       Per HPI, otherwise negative  Respiratory:       Per HPI, otherwise negative  Cardiovascular:       Per HPI, otherwise negative  Gastrointestinal: Negative for vomiting.  Endocrine:       Negative aside from HPI  Genitourinary:       Neg aside from HPI   Musculoskeletal:       Per HPI, otherwise negative  Skin: Negative.   Neurological: Negative for syncope.    Physical Exam Updated Vital Signs BP 125/81   Pulse 80   Temp 98.3 F (36.8 C)   Resp 20   Ht  (1.651 m)   Wt 120.2 kg   SpO2 100%   BMI 44.10 kg/m   Physical Exam Vitals and nursing note reviewed.  Constitutional:      General: She is not in acute distress.    Appearance: She is well-developed and well-nourished. She is obese.  HENT:     Head: Normocephalic and atraumatic.  Eyes:     Extraocular Movements: EOM normal.     Conjunctiva/sclera: Conjunctivae normal.  Cardiovascular:     Rate and Rhythm: Normal rate and regular rhythm.  Pulmonary:     Effort: Pulmonary effort is normal. No respiratory distress.     Breath sounds: Normal breath sounds. No stridor.  Abdominal:     General: There is no distension.  Musculoskeletal:        General: No edema.  Skin:    General: Skin is warm and dry.  Neurological:     Mental Status: She is alert and oriented to person, place, and time.     Cranial Nerves: No cranial nerve deficit.  Psychiatric:        Mood and Affect: Mood and affect normal.     ED Results / Procedures / Treatments   Labs (all labs  ordered are listed, but only abnormal results are displayed) Labs Reviewed  BASIC METABOLIC PANEL - Abnormal; Notable for the  following components:      Result Value   Glucose, Bld 101 (*)    All other components within normal limits  CBC - Abnormal; Notable for the following components:   RBC 5.23 (*)    Hemoglobin 11.9 (*)    MCV 74.0 (*)    MCH 22.8 (*)    RDW 16.6 (*)    All other components within normal limits  TROPONIN I (HIGH SENSITIVITY)  TROPONIN I (HIGH SENSITIVITY)    EKG EKG Interpretation  Date/Time:  Monday March 29 2020 16:15:11 EST Ventricular Rate:  76 PR Interval:  156 QRS Duration: 102 QT Interval:  392 QTC Calculation: 441 R Axis:   -34 Text Interpretation: Sinus rhythm with marked sinus arrhythmia Left axis deviation Minimal voltage criteria for LVH, may be normal variant ( R in aVL ) Anterolateral infarct , age undetermined Abnormal ECG Confirmed by Gerhard Munch [phone]) on 03/29/2020 8:24:24 PM   Radiology DG Chest 2 View  Result Date: 03/29/2020 CLINICAL DATA:  Chest pain. EXAM: CHEST - 2 VIEW COMPARISON:  03/15/2020 FINDINGS: Heart size is mildly enlarged, similar in appearance to prior study. Hiatal hernia is present. There are no focal consolidations or pleural effusions. Stable appearance of minimal atelectasis in the LEFT LOWER lobe. No pulmonary edema. Spinal stimulator overlies the midthoracic spine. IMPRESSION: Stable appearance of mild cardiomegaly and hiatal hernia. Electronically Signed   By: Norva Pavlov M.D.   On: 03/29/2020 16:52    Procedures Procedures   Medications Ordered in ED Medications  ketorolac (TORADOL) 30 MG/ML injection 15 mg (15 mg Intramuscular Given 03/29/20 2104)    ED Course  I have reviewed the triage vital signs and the nursing notes.  Pertinent labs & imaging results that were available during my care of the patient were reviewed by me and considered in my medical decision making (see chart for details).    11:04 PM Patient in no distress, awake, alert. She has no tachycardia, heart rate 90s, sinus, unremarkable on  monitor. This does increase with activity, but is otherwise unremarkable. She and I had a lengthy conversation about her x-ray, labs, no evidence for atypical ACS, pneumothorax, pneumonia or other acute findings. Labs reassuring, x-ray does demonstrate hiatal hernia, which she is aware. 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 Patient is amenable to this plan. MDM Rules/Calculators/A&P   MDM Number of Diagnoses or Management Options Atypical chest pain: established, worsening   Amount and/or Complexity of Data Reviewed Clinical lab tests: reviewed Tests in the radiology section of CPT: reviewed Tests in the medicine section of CPT: reviewed Decide to obtain previous medical records or to obtain history from someone other than the patient: yes Review and summarize past medical records: yes Independent visualization of images, tracings, or specimens: yes  Risk of Complications, Morbidity, and/or Mortality Presenting problems: high Diagnostic procedures: high Management options: high  Critical Care Total time providing critical care: < 30 minutes  Patient Progress Patient progress: stable  Final Clinical Impression(s) / ED Diagnoses Final diagnoses:  Atypical chest pain    Rx / DC Orders ED Discharge Orders         Ordered    traMADol (ULTRAM) 50 MG tablet  Every 8 hours PRN  03/29/20 2307           Gerhard Munch, MD 03/29/20 2308

## 2020-03-29 NOTE — ED Triage Notes (Signed)
C/o chest pain for 2 days 

## 2020-04-06 ENCOUNTER — Telehealth: Payer: Self-pay | Admitting: *Deleted

## 2020-04-06 NOTE — Telephone Encounter (Signed)
Received refill request from [facility] for lidocaine sol 2% VISC 10ML's by mouth every 4 hours or prior to meals and as neede dfor abd pain/chest pain.

## 2020-04-06 NOTE — Telephone Encounter (Signed)
Not a patient of ours. Seen by [facility].

## 2020-04-06 NOTE — Telephone Encounter (Signed)
Noted. Refill request denial faxed back

## 2020-08-16 ENCOUNTER — Encounter (HOSPITAL_COMMUNITY): Payer: Self-pay | Admitting: *Deleted

## 2020-08-16 ENCOUNTER — Encounter (HOSPITAL_COMMUNITY): Payer: Self-pay | Admitting: Oncology

## 2020-08-16 ENCOUNTER — Other Ambulatory Visit: Payer: Self-pay

## 2020-08-16 ENCOUNTER — Emergency Department (HOSPITAL_COMMUNITY)
Admission: EM | Admit: 2020-08-16 | Discharge: 2020-08-16 | Disposition: A | Discharge status: Home / Self Care | Payer: Medicare Other | Attending: Emergency Medicine | Admitting: Emergency Medicine

## 2020-08-16 DIAGNOSIS — Z79899 Other long term (current) drug therapy: Secondary | ICD-10-CM | POA: Insufficient documentation

## 2020-08-16 DIAGNOSIS — Z7984 Long term (current) use of oral hypoglycemic drugs: Secondary | ICD-10-CM | POA: Insufficient documentation

## 2020-08-16 DIAGNOSIS — M546 Pain in thoracic spine: Secondary | ICD-10-CM | POA: Diagnosis not present

## 2020-08-16 DIAGNOSIS — I10 Essential (primary) hypertension: Secondary | ICD-10-CM | POA: Diagnosis not present

## 2020-08-16 DIAGNOSIS — M545 Low back pain, unspecified: Secondary | ICD-10-CM | POA: Diagnosis not present

## 2020-08-16 MED ORDER — LIDOCAINE 5 % EX PTCH
1.0000 | MEDICATED_PATCH | CUTANEOUS | Status: DC
  Administered 2020-08-16: 1 via TRANSDERMAL
  Filled 2020-08-16: qty 1

## 2020-08-16 MED ORDER — LIDOCAINE 5 % EX PTCH: 1.0000 | MEDICATED_PATCH | CUTANEOUS | 0 refills | Status: AC

## 2020-08-16 NOTE — ED Triage Notes (Signed)
Pt bib ems for c/o right flank pain that started today; pt took aleve at home with no relief

## 2020-08-16 NOTE — ED Triage Notes (Signed)
Pt c/o lower right side back pain; pt denies any injury and any urinary symptoms

## 2020-08-16 NOTE — Discharge Instructions (Addendum)
Lidoderm patch as needed as prescribed. Recheck with your doctor if pain continues.

## 2020-08-16 NOTE — ED Notes (Signed)
Pt reports tenderness to thoracic spine region with associated right rib/flank pain that is sharp and occurred suddenly this morning. No associated injury noted. Full ROM in upper extremities. Chronic bilateral knee pain limit ROM in lower extremities. No SOB, fever, or diaphoresis. No chest pain. Tender to touch and sharp pain with movement reported. No altered urinary symptoms.

## 2020-08-16 NOTE — ED Notes (Signed)
ED Provider at bedside. 

## 2020-08-16 NOTE — ED Provider Notes (Signed)
[facility] EMERGENCY DEPARTMENT Provider Note   CSN: [id number] Arrival date & time: 08/16/20  1607     History Chief Complaint  Patient presents with   Back Pain    Tracey Mccarthy is a 55 y.o. female.  55 year old female with history of degenerative arthritis, rheumatoid arthritis with additional history as listed below presents with complaint of pain across her mid back which occurred today while she was sitting on her porch a few hours prior to arrival.  Denies recent falls or injuries, cough, shortness of breath, abdominal pain, loss of bowel or bladder control or extremity weakness.  Pain is worse with movement.  No other complaints or concerns today.  Has not taken anything for her pain.      Past Medical History:  Diagnosis Date   Anxiety    Degenerative arthritis    Depression    GERD (gastroesophageal reflux disease)    Headache    Hypertension    Obese    Optic neuritis    Rheumatoid arthritis [facility])     Patient Active Problem List   Diagnosis Date Noted   Morbid obesity (HCC) 05/23/2018   Heartburn    Dysphagia 04/17/2017   Iron deficiency anemia    Microcytic anemia    Large hiatal hernia 05/16/2016   GERD (gastroesophageal reflux disease) 01/17/2016   Nausea with vomiting 01/17/2016   Abdominal pain, lower 01/17/2016   Special screening for malignant neoplasms, colon    Subjective vision disturbance 12/17/2012   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 Darrick Penna: Moderate sized external hemorrhoids, normal terminal ileum and colon. Next colonoscopy 2026   COLONOSCOPY N/A 11/20/2016   Procedure: COLONOSCOPY;  Surgeon: West Bali, MD;   Location: [facility];  Service: Endoscopy;  Laterality: N/A;  12:00pm   ESOPHAGEAL MANOMETRY N/A 12/05/2017   Procedure: ESOPHAGEAL MANOMETRY (EM);  Surgeon: Napoleon Form, MD;  Location: [facility];  Service: Endoscopy;  Laterality: N/A;   ESOPHAGOGASTRODUODENOSCOPY N/A 01/18/2016   Procedure: ESOPHAGOGASTRODUODENOSCOPY (EGD);  Surgeon: West Bali, MD;  Location: [facility];  Service: Endoscopy;  Laterality: N/A;  145   ESOPHAGOGASTRODUODENOSCOPY N/A 10/23/2016   Procedure: ESOPHAGOGASTRODUODENOSCOPY (EGD);  Surgeon: West Bali, MD;  Location: [facility];  Service: Endoscopy;  Laterality: N/A;  8:30am   GIVENS CAPSULE STUDY N/A 10/23/2016   Procedure: GIVENS CAPSULE STUDY;  Surgeon: West Bali, MD;  Location: [facility];  Service: Endoscopy;  Laterality: N/A;   KNEE SURGERY Left    NISSEN FUNDOPLICATION     according to prior imaging, patient not clear on what sort of surgery procedures she had. states she had two surgeries for this   SAVORY DILATION N/A 01/18/2016   Procedure: SAVORY DILATION;  Surgeon: West Bali, MD;  Location: [facility];  Service: Endoscopy;  Laterality: N/A;     OB History   No obstetric history on file.     Family History  Problem Relation Age of Onset   Pneumonia Father    Diabetes Father    Hypertension Father    Heart attack Father    Colon cancer  Father        greater than 60   Colon cancer Paternal Aunt        greater than 13    Social History   Tobacco Use   Smoking status: Never   Smokeless tobacco: Never  Vaping Use   Vaping Use: Never used  Substance Use Topics   Alcohol use: No   Drug use: No    Home Medications Prior to Admission medications   Medication Sig Start Date End Date Taking? Authorizing Provider  lidocaine (LIDODERM) 5 % Place 1 patch onto the skin daily. Remove & Discard patch within 12 hours or as directed by MD 08/16/20  Yes Jeannie Fend, PA-C  amitriptyline (ELAVIL) 50 MG tablet  Take 50 mg by mouth daily. 09/27/11   [provider]  amLODipine (NORVASC) 5 MG tablet Take 5 mg by mouth daily. 03/24/20   [provider]  carbamazepine (TEGRETOL XR) 400 MG 12 hr tablet Take 800 mg by mouth 2 (two) times daily. 01/15/17   [provider]  citalopram (CELEXA) 10 MG tablet Take 10 mg by mouth daily.     [provider]  dicyclomine (BENTYL) 20 MG tablet Take 1 tablet (20 mg total) by mouth 2 (two) times daily. Patient not taking: No sig reported 05/01/17   Aviva Kluver B, PA-C  doxepin (SINEQUAN) 10 MG capsule Take 20 mg by mouth at bedtime. 01/15/17   [provider]  famotidine (PEPCID) 40 MG tablet Take 40 mg by mouth 2 (two) times daily. 03/24/20   [provider]  ferrous gluconate (FERGON) 324 MG tablet Take 1 tablet by mouth daily. 12/29/16   [provider]  furosemide (LASIX) 20 MG tablet Take 20 mg by mouth daily.     [provider]  glipiZIDE (GLUCOTROL) 5 MG tablet Take 5 mg by mouth 2 (two) times daily. 03/24/20   [provider]  HM FEXOFENADINE HCL 180 MG tablet Take 180 mg by mouth daily as needed. 10/30/17   [provider]  hydrochlorothiazide (HYDRODIURIL) 12.5 MG tablet Take 12.5 mg by mouth daily. 03/03/20   [provider]  lidocaine (XYLOCAINE) 2 % solution Using a syringe, 10 mL PO q4h or 30 MINS PRIOR TO MEALS AND PRN FOR ABDOMINAL/CHEST PAIN. NO MORE> 8 DOSES/DAY. 08/13/18   Fields, Darleene Cleaver, MD  lisinopril-hydrochlorothiazide (PRINZIDE,ZESTORETIC) 20-12.5 MG tablet Take 1 tablet by mouth daily. Patient not taking: No sig reported 10/30/17   [provider]  losartan (COZAAR) 50 MG tablet Take 50 mg by mouth daily. 03/03/20   [provider]  losartan-hydrochlorothiazide (HYZAAR) 50-12.5 MG tablet Take 1 tablet by mouth daily. Patient not taking: No sig reported 03/24/20   [provider]  meloxicam (MOBIC) 7.5 MG tablet Take 1 tablet by  mouth daily. 08/20/19   [provider]  metoprolol tartrate (LOPRESSOR) 50 MG tablet Take 50 mg by mouth 2 (two) times daily. Pt has 2 bottles (one has bid and other has daily). States she has been taking both bottles 08/30/16   [provider]  naproxen (NAPROSYN) 500 MG tablet Take 500 mg by mouth 2 (two) times daily as needed. 02/19/20   [provider]  omeprazole (PRILOSEC) 20 MG capsule Take 1 capsule by mouth daily.    [provider]  ondansetron (ZOFRAN) 4 MG tablet Take 1 tablet by mouth every 8 (eight) hours as needed. Patient not taking: Reported on 03/29/2020    [provider]  Ostomy Supplies (SKIN TAC ADHESIVE BARRIER WIPE) MISC by Does not apply route. 06/20/19   [provider]  pantoprazole (PROTONIX) 40 MG tablet Take 1 tablet (40 mg total) by mouth daily before breakfast. 04/17/17   Tiffany Kocher, PA-C  potassium chloride (K-DUR) 10 MEQ tablet Take 10 mEq by mouth daily. Patient not taking: Reported on 03/29/2020    [provider]  potassium chloride SA (KLOR-CON) 20 MEQ tablet Take 20 mEq by mouth 2 (two) times daily. 03/16/20   [provider]  promethazine (PHENERGAN) 25 MG tablet Take 25 mg by mouth every 6 (six) hours as needed for nausea or vomiting. Patient not taking: Reported on 03/29/2020    [provider]  sucralfate (CARAFATE) 1 g tablet Take 1 tablet (1 g total) by mouth 4 (four) times daily -  with meals and at bedtime for 7 days. Patient not taking: Reported on 03/29/2020 05/01/17 05/08/17  Aviva Kluver B, PA-C  tetrahydrozoline 0.05 % ophthalmic solution Place 1 drop into both eyes 3 (three) times daily as needed (for dry eyes.). Patient not taking: Reported on 03/29/2020    [provider]  tiZANidine (ZANAFLEX) 4 MG tablet Take 4 mg by mouth 3 (three) times daily as needed for muscle spasms.    [provider]  traMADol (ULTRAM) 50 MG tablet Take 1 tablet (50 mg total) by  mouth every 8 (eight) hours as needed. For pain. 03/29/20   Gerhard Munch, MD    Allergies    Codeine  Review of Systems   Review of Systems  Constitutional:  Negative for fever.  Gastrointestinal:  Negative for abdominal pain, nausea and vomiting.  Genitourinary:  Negative for difficulty urinating.  Musculoskeletal:  Positive for back pain.  Skin:  Negative for rash and wound.  Allergic/Immunologic: Positive for immunocompromised state.  Neurological:  Negative for weakness and numbness.  All other systems reviewed and are negative.  Physical Exam Updated Vital Signs BP 108/71   Pulse 71   Temp 98.2 F (36.8 C) (Oral)   Resp 18   Ht 5\' 5"  (1.651 m)   Wt 120.2 kg   SpO2 95%   BMI 44.10 kg/m   Physical Exam Vitals and nursing note reviewed.  Constitutional:      General: She is not in acute distress.    Appearance: She is well-developed. She is not diaphoretic.  HENT:     Head: Normocephalic and atraumatic.  Cardiovascular:     Pulses: Normal pulses.  Pulmonary:     Effort: Pulmonary effort is normal.  Abdominal:     Palpations: Abdomen is soft.     Tenderness: There is no abdominal tenderness.  Musculoskeletal:        General: Tenderness present. No swelling or deformity.       Back:     Right lower leg: No edema.     Left lower leg: No edema.     Comments: Tenderness with palpation across the mid back, no midline or bony tenderness, no skin changes.  Equal leg strength and sensation with normal reflexes.    Skin:    General: Skin is warm and dry.     Findings: No bruising, erythema or rash.  Neurological:     Mental Status: She is alert and oriented to person, place, and time.     Sensory: No sensory deficit.     Motor: No weakness.  Psychiatric:        Behavior: Behavior normal.  ED Results / Procedures / Treatments   Labs (all labs ordered are listed, but only abnormal results are displayed) Labs Reviewed - No data to  display  EKG None  Radiology No results found.  Procedures Procedures   Medications Ordered in ED Medications  lidocaine (LIDODERM) 5 % 1 patch (1 patch Transdermal Patch Applied 08/16/20 1805)    ED Course  I have reviewed the triage vital signs and the nursing notes.  Pertinent labs & imaging results that were available during my care of the patient were reviewed by me and considered in my medical decision making (see chart for details).  Clinical Course as of 08/16/20 1807  Mon Aug 16, 2020  7554 55 year old female with complaint of mid back pain without injury or any other associated symptoms today.  Found to have tenderness without skin changes, exam is otherwise unremarkable.  Patient is ambulatory with assistance (uses a cane and walker at home).  Plan is to apply Lidoderm patch, give prescription for same and follow-up with PCP if pain continues. [LM]    Clinical Course User Index [LM] Alden Hipp   MDM Rules/Calculators/A&P                           Final Clinical Impression(s) / ED Diagnoses Final diagnoses:  Acute bilateral low back pain without sciatica    Rx / DC Orders ED Discharge Orders          Ordered    lidocaine (LIDODERM) 5 %  Every 24 hours        08/16/20 1801             Jeannie Fend, PA-C 08/16/20 1807    Mancel Bale, MD 08/17/20 0010

## 2021-07-12 IMAGING — DX DG CHEST 2V
2 series · 2 of 2 positions shown · non-contrast
Comparison: 03/15/2020

CLINICAL DATA: Chest pain.

EXAM:
CHEST - 2 VIEW

[chest pa]
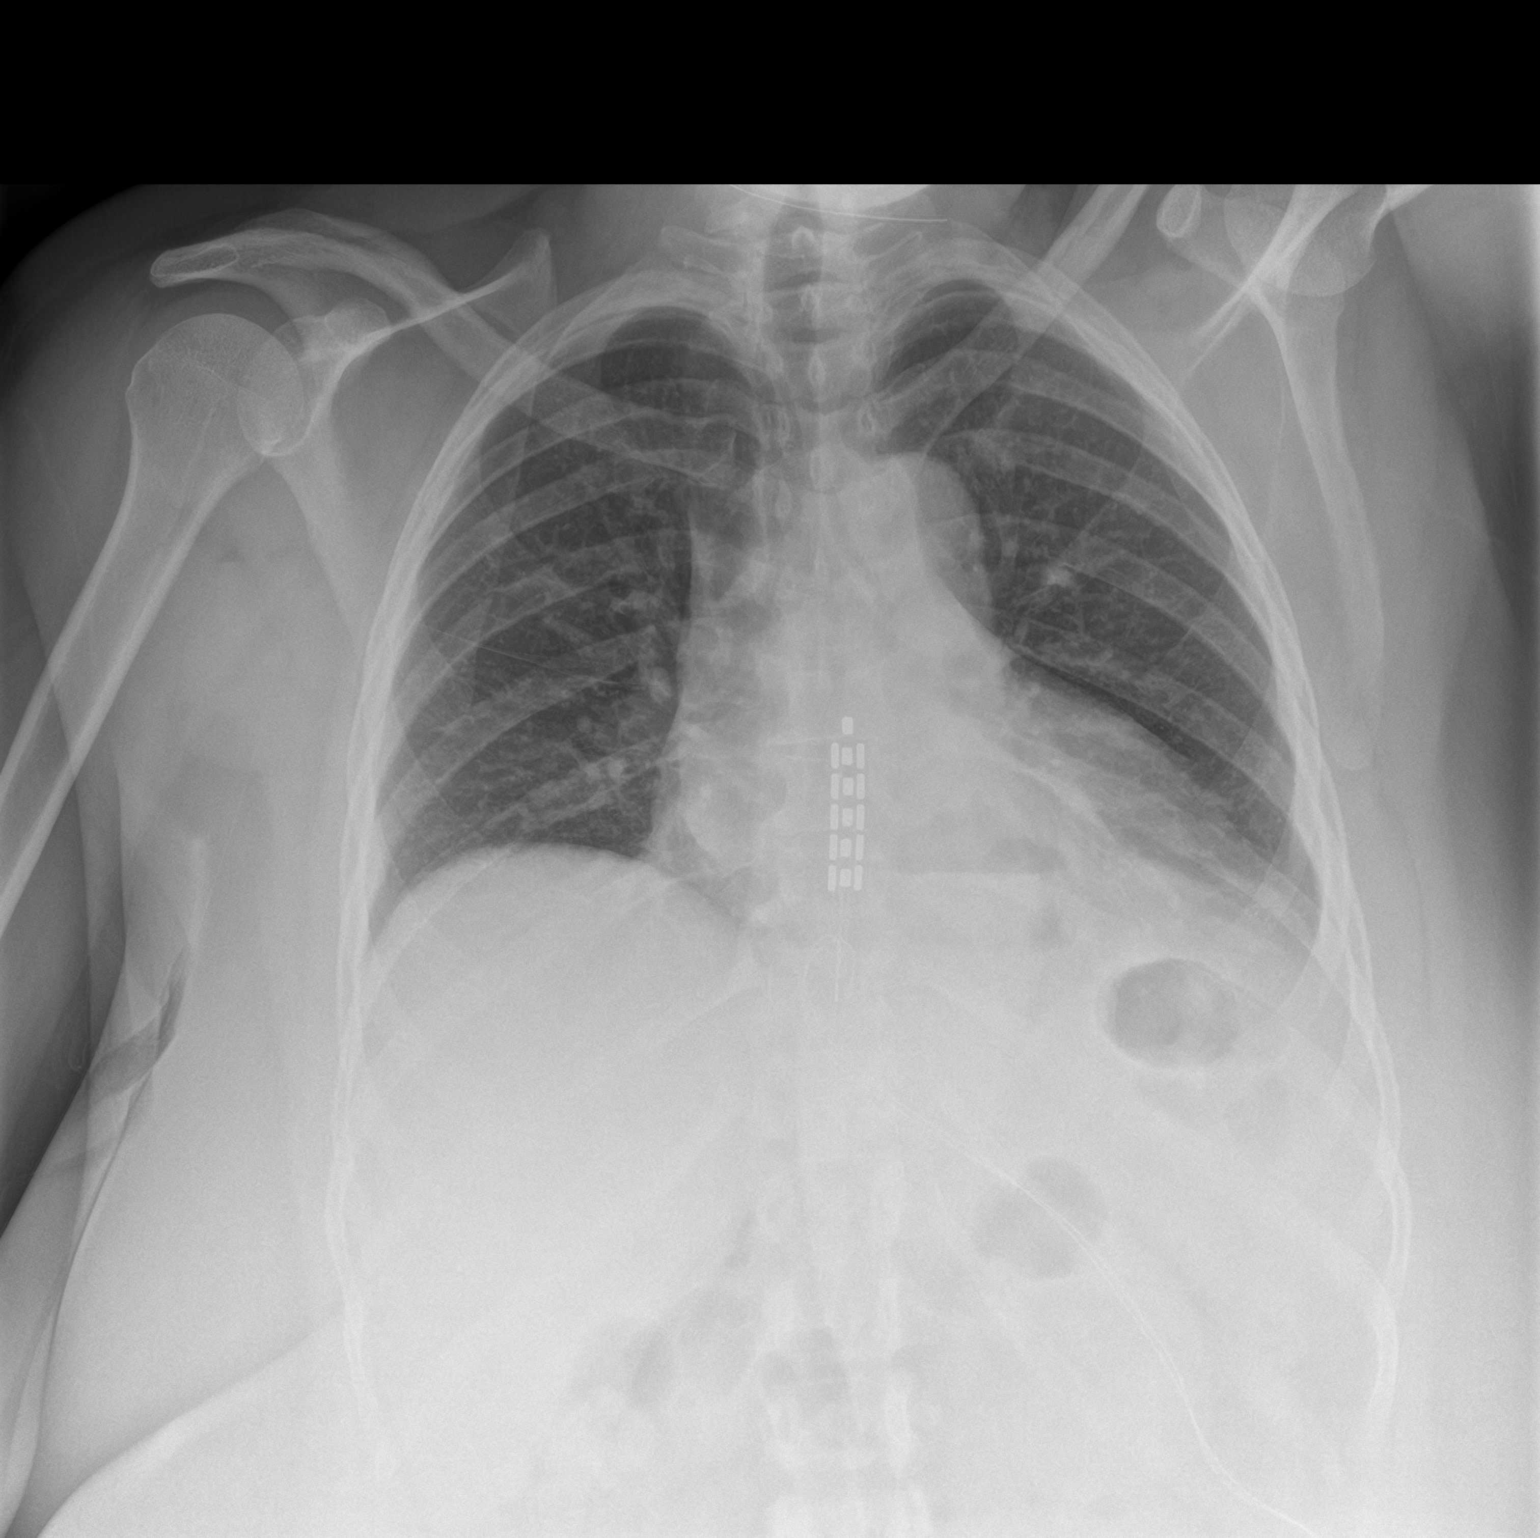

[chest lat]
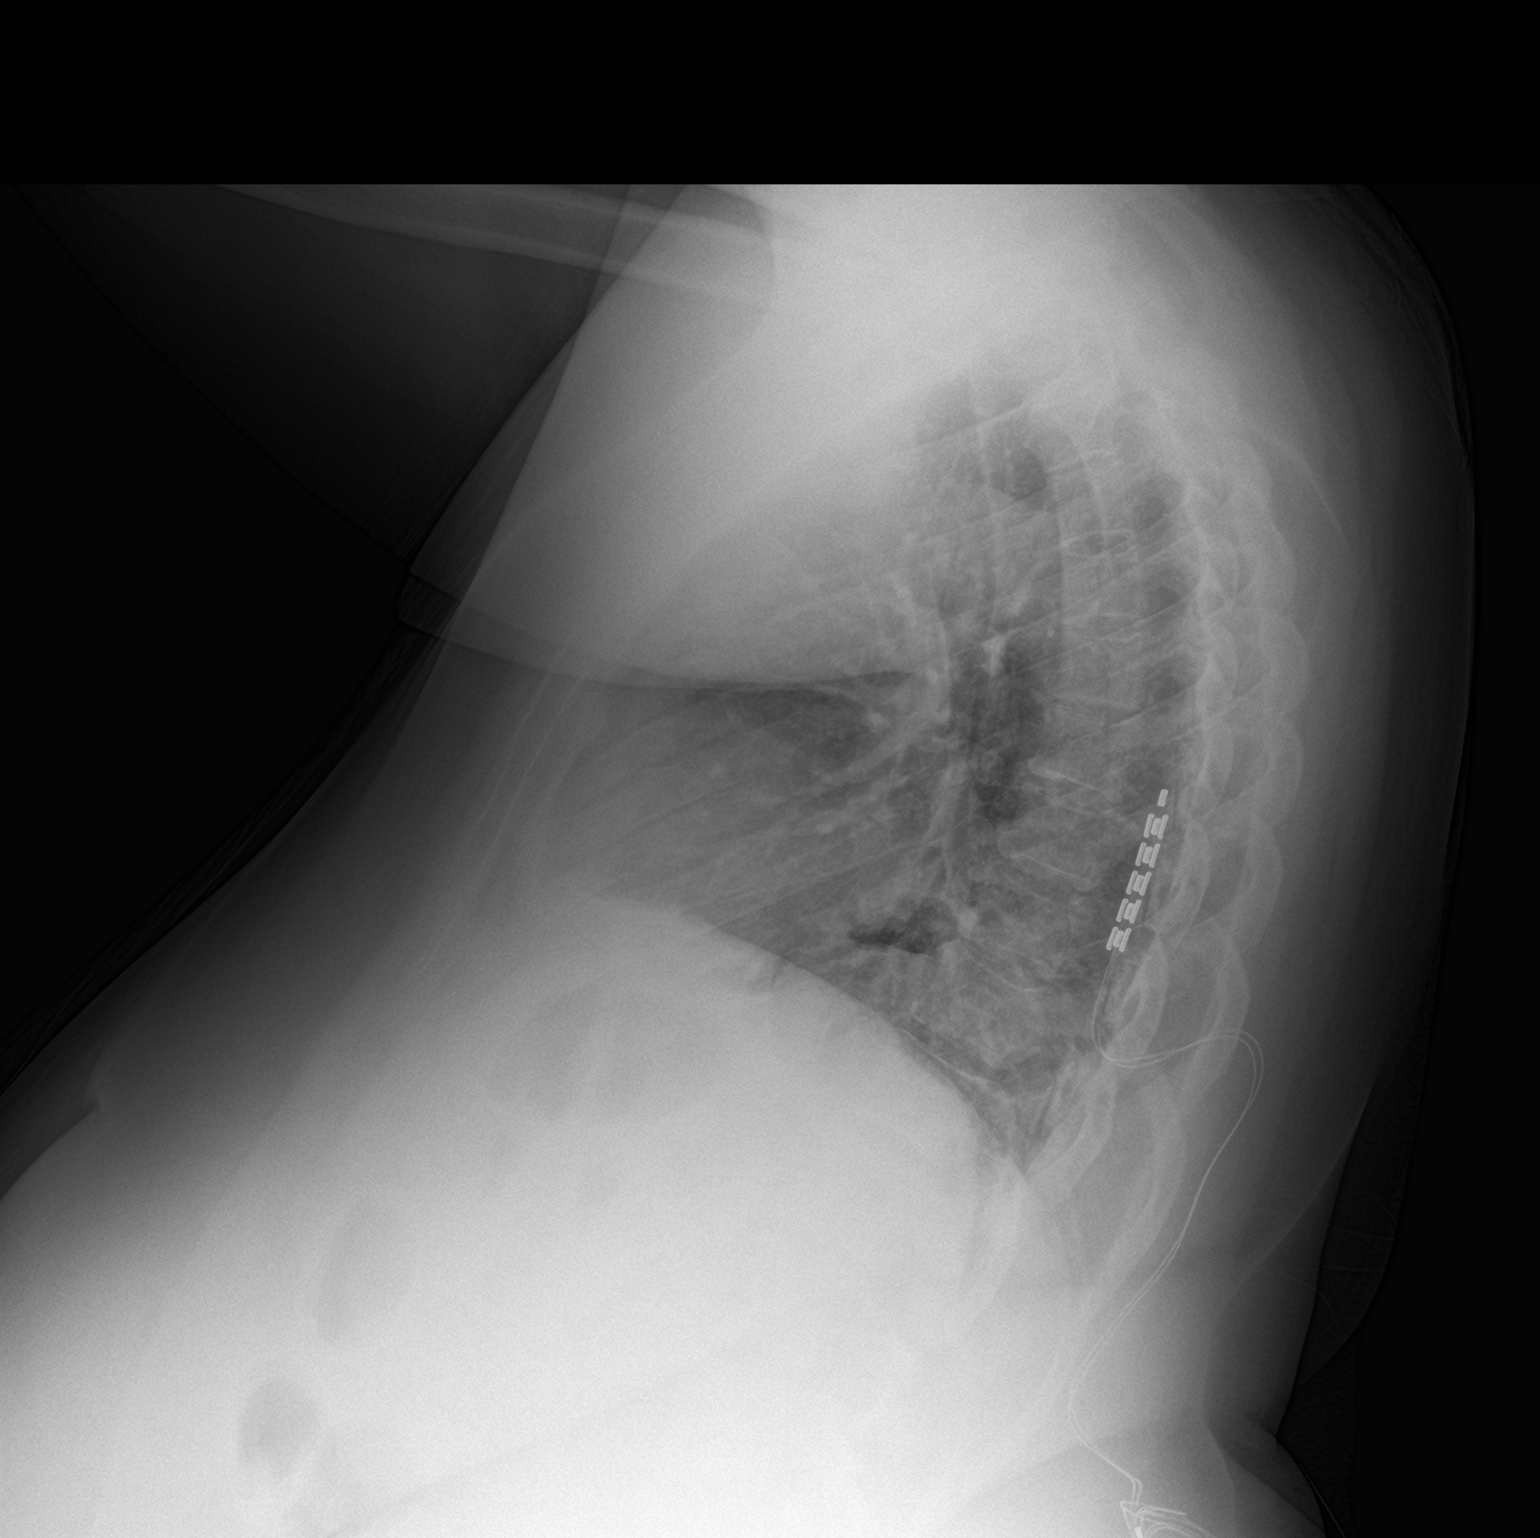

[2 of 2 positions shown; findings below may reference images not displayed]

FINDINGS: Heart size is mildly enlarged, similar in appearance to prior study.
Hiatal hernia is present. There are no focal consolidations or
pleural effusions. Stable appearance of minimal atelectasis in the
LEFT LOWER lobe. No pulmonary edema. Spinal stimulator overlies the
midthoracic spine.
IMPRESSION: Stable appearance of mild cardiomegaly and hiatal hernia.

## 2021-11-30 ENCOUNTER — Encounter: Payer: Self-pay | Admitting: *Deleted

## 2022-06-28 DIAGNOSIS — F320 Major depressive disorder, single episode, mild: Secondary | ICD-10-CM | POA: Diagnosis not present

## 2022-06-28 DIAGNOSIS — E1165 Type 2 diabetes mellitus with hyperglycemia: Secondary | ICD-10-CM | POA: Diagnosis not present

## 2022-06-28 DIAGNOSIS — G43909 Migraine, unspecified, not intractable, without status migrainosus: Secondary | ICD-10-CM | POA: Diagnosis not present

## 2022-06-28 DIAGNOSIS — M545 Low back pain, unspecified: Secondary | ICD-10-CM | POA: Diagnosis not present
